# Patient Record
Sex: Female | Born: 1950 | Race: White | Hispanic: No | Marital: Married | Smoking: Never smoker
Health system: Southern US, Community
[De-identification: ages and names within clinical notes are randomized; demographics above are authoritative.]

## PROBLEM LIST (undated history)

## (undated) DIAGNOSIS — Z8719 Personal history of other diseases of the digestive system: Secondary | ICD-10-CM

## (undated) DIAGNOSIS — C50919 Malignant neoplasm of unspecified site of unspecified female breast: Secondary | ICD-10-CM

## (undated) DIAGNOSIS — N200 Calculus of kidney: Secondary | ICD-10-CM

## (undated) DIAGNOSIS — E785 Hyperlipidemia, unspecified: Secondary | ICD-10-CM

## (undated) DIAGNOSIS — C801 Malignant (primary) neoplasm, unspecified: Secondary | ICD-10-CM

## (undated) DIAGNOSIS — Z8709 Personal history of other diseases of the respiratory system: Secondary | ICD-10-CM

## (undated) DIAGNOSIS — R202 Paresthesia of skin: Secondary | ICD-10-CM

## (undated) DIAGNOSIS — Z973 Presence of spectacles and contact lenses: Secondary | ICD-10-CM

## (undated) DIAGNOSIS — K8020 Calculus of gallbladder without cholecystitis without obstruction: Secondary | ICD-10-CM

## (undated) DIAGNOSIS — J101 Influenza due to other identified influenza virus with other respiratory manifestations: Secondary | ICD-10-CM

## (undated) DIAGNOSIS — K642 Third degree hemorrhoids: Secondary | ICD-10-CM

## (undated) DIAGNOSIS — Z86010 Personal history of colonic polyps: Secondary | ICD-10-CM

## (undated) DIAGNOSIS — R200 Anesthesia of skin: Secondary | ICD-10-CM

## (undated) DIAGNOSIS — K219 Gastro-esophageal reflux disease without esophagitis: Secondary | ICD-10-CM

## (undated) DIAGNOSIS — J111 Influenza due to unidentified influenza virus with other respiratory manifestations: Secondary | ICD-10-CM

## (undated) DIAGNOSIS — Z923 Personal history of irradiation: Secondary | ICD-10-CM

## (undated) HISTORY — DX: Calculus of gallbladder without cholecystitis without obstruction: K80.20

## (undated) HISTORY — DX: Influenza due to unidentified influenza virus with other respiratory manifestations: J11.1

## (undated) HISTORY — DX: Gastro-esophageal reflux disease without esophagitis: K21.9

## (undated) HISTORY — DX: Hyperlipidemia, unspecified: E78.5

## (undated) HISTORY — PX: CYSTO: SHX6284

## (undated) HISTORY — DX: Personal history of colonic polyps: Z86.010

## (undated) HISTORY — PX: TONSILLECTOMY: SUR1361

## (undated) HISTORY — DX: Third degree hemorrhoids: K64.2

## (undated) HISTORY — PX: OTHER SURGICAL HISTORY: SHX169

## (undated) HISTORY — DX: Calculus of kidney: N20.0

## (undated) HISTORY — DX: Influenza due to other identified influenza virus with other respiratory manifestations: J10.1

## (undated) HISTORY — PX: COLONOSCOPY: SHX174

## (undated) HISTORY — PX: ERCP: SHX60

## (undated) HISTORY — PX: OVARIAN CYST REMOVAL: SHX89

## (undated) HISTORY — PX: HEMORRHOID BANDING: SHX5850

## (undated) HISTORY — PX: POLYPECTOMY: SHX149

---

## 1978-02-23 HISTORY — PX: DILATION AND CURETTAGE OF UTERUS: SHX78

## 2003-07-06 DIAGNOSIS — Z86010 Personal history of colonic polyps: Secondary | ICD-10-CM

## 2003-07-06 DIAGNOSIS — Z860101 Personal history of adenomatous and serrated colon polyps: Secondary | ICD-10-CM

## 2003-07-06 HISTORY — DX: Personal history of adenomatous and serrated colon polyps: Z86.0101

## 2003-07-06 HISTORY — DX: Personal history of colonic polyps: Z86.010

## 2004-02-24 HISTORY — PX: ABDOMINAL HYSTERECTOMY: SHX81

## 2006-02-23 HISTORY — PX: CHOLECYSTECTOMY: SHX55

## 2006-03-31 ENCOUNTER — Ambulatory Visit (HOSPITAL_BASED_OUTPATIENT_CLINIC_OR_DEPARTMENT_OTHER): Admission: RE | Admit: 2006-03-31 | Discharge: 2006-03-31 | Payer: Self-pay | Admitting: Urology

## 2007-07-29 ENCOUNTER — Ambulatory Visit: Payer: Self-pay | Admitting: Internal Medicine

## 2007-07-29 DIAGNOSIS — R209 Unspecified disturbances of skin sensation: Secondary | ICD-10-CM | POA: Insufficient documentation

## 2007-07-29 DIAGNOSIS — R109 Unspecified abdominal pain: Secondary | ICD-10-CM | POA: Insufficient documentation

## 2007-07-29 DIAGNOSIS — E785 Hyperlipidemia, unspecified: Secondary | ICD-10-CM

## 2007-07-29 DIAGNOSIS — R112 Nausea with vomiting, unspecified: Secondary | ICD-10-CM

## 2007-08-01 LAB — CONVERTED CEMR LAB
ALT: 43 units/L — ABNORMAL HIGH (ref 0–35)
AST: 30 units/L (ref 0–37)
Albumin: 4 g/dL (ref 3.5–5.2)
Alkaline Phosphatase: 108 units/L (ref 39–117)
Amylase: 66 units/L (ref 27–131)
BUN: 15 mg/dL (ref 6–23)
Basophils Relative: 0.4 % (ref 0.0–1.0)
CO2: 30 meq/L (ref 19–32)
Chloride: 103 meq/L (ref 96–112)
Creatinine, Ser: 0.8 mg/dL (ref 0.4–1.2)
Eosinophils Absolute: 0.1 10*3/uL (ref 0.0–0.7)
Eosinophils Relative: 1.7 % (ref 0.0–5.0)
Glucose, Bld: 100 mg/dL — ABNORMAL HIGH (ref 70–99)
Lymphocytes Relative: 28.7 % (ref 12.0–46.0)
MCV: 90.9 fL (ref 78.0–100.0)
Monocytes Relative: 5.1 % (ref 3.0–12.0)
Neutrophils Relative %: 64.1 % (ref 43.0–77.0)
Nitrite: NEGATIVE
Platelets: 448 10*3/uL — ABNORMAL HIGH (ref 150–400)
Potassium: 4.3 meq/L (ref 3.5–5.1)
RBC: 4.41 M/uL (ref 3.87–5.11)
Specific Gravity, Urine: <=1.005 (ref 1.000–1.03)
TSH: 2.11 microintl units/mL (ref 0.35–5.50)
Total Protein: 7.3 g/dL (ref 6.0–8.3)
Urine Glucose: NEGATIVE mg/dL
Urobilinogen, UA: 0.2 (ref 0.0–1.0)
WBC: 6.2 10*3/uL (ref 4.5–10.5)

## 2007-08-18 ENCOUNTER — Encounter: Payer: Self-pay | Admitting: Internal Medicine

## 2007-08-23 ENCOUNTER — Encounter: Payer: Self-pay | Admitting: Internal Medicine

## 2007-10-25 ENCOUNTER — Ambulatory Visit: Payer: Self-pay | Admitting: Internal Medicine

## 2007-10-27 LAB — CONVERTED CEMR LAB
ALT: 14 units/L (ref 0–35)
AST: 18 units/L (ref 0–37)
Albumin: 3.8 g/dL (ref 3.5–5.2)
Alkaline Phosphatase: 62 units/L (ref 39–117)
Sed Rate: 9 mm/hr (ref 0–22)

## 2008-04-23 ENCOUNTER — Ambulatory Visit: Payer: Self-pay | Admitting: Internal Medicine

## 2008-04-23 DIAGNOSIS — K219 Gastro-esophageal reflux disease without esophagitis: Secondary | ICD-10-CM

## 2008-05-07 ENCOUNTER — Encounter: Payer: Self-pay | Admitting: Internal Medicine

## 2009-03-22 ENCOUNTER — Ambulatory Visit: Payer: Self-pay | Admitting: Internal Medicine

## 2009-03-22 DIAGNOSIS — J019 Acute sinusitis, unspecified: Secondary | ICD-10-CM | POA: Insufficient documentation

## 2010-03-25 NOTE — Assessment & Plan Note (Signed)
Summary: COLD SYMPTOMS--[facility]C   Vital Signs:  Patient profile:   60 year old female Height:      65 inches Weight:      158 pounds BMI:     26.39 Temp:     98.1 degrees F oral Pulse rate:   70 / minute BP sitting:   104 / 66  (left arm)  Vitals Entered By: Tora Perches (March 22, 2009 3:47 PM) CC: cold sx Is Patient Diabetic? No   CC:  cold sx.  History of Present Illness: The patient presents with complaints of, cough, sinus congestion and drainge of 6 wks duration. Not better with OTC meds. Chest hurts with coughing.   The mucus is colored.   Preventive Screening-Counseling & Management  Alcohol-Tobacco     Smoking Status: never  Current Medications (verified): 1)  Ranitidine Hcl 150 Mg Caps (Ranitidine Hcl) .Marland Kitchen.. 1 Po Bid 2)  Vitamin D3 1000 Unit  Tabs (Cholecalciferol) .Marland Kitchen.. 1 By Mouth Daily  Allergies (verified): No Known Drug Allergies  Social History: Smoking Status:  never  Physical Exam  General:  Well-developed,well-nourished,in no acute distress; alert,appropriate and cooperative throughout examination Mouth:  Erythematous throat mucosa and intranasal erythema.  Lungs:  Normal respiratory effort, chest expands symmetrically. Lungs are clear to auscultation, no crackles or wheezes. Heart:  Normal rate and regular rhythm. S1 and S2 normal without gallop, murmur, click, rub or other extra sounds. Skin:  Intact without suspicious lesions or rashes   Impression & Recommendations:  Problem # 1:  SINUSITIS, ACUTE (ICD-461.9) Assessment New  Her updated medication list for this problem includes:    Ceftin 500 Mg Tabs (Cefuroxime axetil) .Marland Kitchen... 1 by mouth bid  Complete Medication List: 1)  Ranitidine Hcl 150 Mg Caps (Ranitidine hcl) .Marland Kitchen.. 1 po bid 2)  Vitamin D3 1000 Unit Tabs (Cholecalciferol) .Marland Kitchen.. 1 by mouth daily 3)  Ceftin 500 Mg Tabs (Cefuroxime axetil) .Marland Kitchen.. 1 by mouth bid  Patient Instructions: 1)  Call if you are not better in a reasonable amount of  time or if worse.  Prescriptions: CEFTIN 500 MG TABS (CEFUROXIME AXETIL) 1 by mouth bid  #20 x 1   Entered and Authorized by:   Tresa Garter MD   Signed by:   Tresa Garter MD on 03/22/2009   Method used:   Print then Give to Patient   RxID:   [id number]

## 2010-03-31 ENCOUNTER — Telehealth: Payer: Self-pay | Admitting: Internal Medicine

## 2010-04-04 ENCOUNTER — Other Ambulatory Visit: Payer: Self-pay

## 2010-04-04 ENCOUNTER — Encounter (INDEPENDENT_AMBULATORY_CARE_PROVIDER_SITE_OTHER): Payer: Self-pay | Admitting: *Deleted

## 2010-04-04 ENCOUNTER — Other Ambulatory Visit: Payer: Self-pay | Admitting: Internal Medicine

## 2010-04-04 DIAGNOSIS — E7800 Pure hypercholesterolemia, unspecified: Secondary | ICD-10-CM

## 2010-04-04 DIAGNOSIS — E785 Hyperlipidemia, unspecified: Secondary | ICD-10-CM

## 2010-04-04 DIAGNOSIS — Z0000 Encounter for general adult medical examination without abnormal findings: Secondary | ICD-10-CM

## 2010-04-04 LAB — BASIC METABOLIC PANEL
BUN: 20 mg/dL (ref 6–23)
Creatinine, Ser: 0.8 mg/dL (ref 0.4–1.2)
GFR: 81.99 mL/min (ref >60.00)
Glucose, Bld: 88 mg/dL (ref 70–99)
Potassium: 4.4 mEq/L (ref 3.5–5.1)

## 2010-04-04 LAB — LIPID PANEL
Cholesterol: 257 mg/dL — ABNORMAL HIGH (ref 0–200)
HDL: 65.4 mg/dL (ref >39.00)
Total CHOL/HDL Ratio: 4
Triglycerides: 77 mg/dL (ref 0.0–149.0)
VLDL: 15.4 mg/dL (ref 0.0–40.0)

## 2010-04-04 LAB — CBC WITH DIFFERENTIAL/PLATELET
Basophils Absolute: 0 10*3/uL (ref 0.0–0.1)
Eosinophils Relative: 3.1 % (ref 0.0–5.0)
HCT: 40.5 % (ref 36.0–46.0)
Lymphs Abs: 2 10*3/uL (ref 0.7–4.0)
MCV: 90.8 fl (ref 78.0–100.0)
Monocytes Absolute: 0.3 10*3/uL (ref 0.1–1.0)
Monocytes Relative: 7.9 % (ref 3.0–12.0)
Neutrophils Relative %: 38.2 % — ABNORMAL LOW (ref 43.0–77.0)
Platelets: 340 10*3/uL (ref 150.0–400.0)
RDW: 12.8 % (ref 11.5–14.6)
WBC: 4 10*3/uL — ABNORMAL LOW (ref 4.5–10.5)

## 2010-04-04 LAB — HEPATIC FUNCTION PANEL
Bilirubin, Direct: 0.2 mg/dL (ref 0.0–0.3)
Total Bilirubin: 1 mg/dL (ref 0.3–1.2)

## 2010-04-04 LAB — URINALYSIS
Bilirubin Urine: NEGATIVE
Ketones, ur: NEGATIVE
Leukocytes, UA: NEGATIVE
Urine Glucose: NEGATIVE
Urobilinogen, UA: 0.2 (ref 0.0–1.0)

## 2010-04-04 LAB — TSH: TSH: 1.45 u[IU]/mL (ref 0.35–5.50)

## 2010-04-10 NOTE — Progress Notes (Signed)
Summary: NEED OV  Phone Note Call from Patient Call back at Home Phone [phone]   Caller: Patient--[phone] Call For: Tresa Garter MD Summary of Call: Pt states she wants to do cpx labs only, she does not want to sched cpx office visit. She does pap/mammo with her gynecologist. Please advise Initial call taken by: Verdell Face,  March 31, 2010 10:52 AM  Follow-up for Phone Call        Last office visit for f/u was 04/2008. At last visit pt was advised to come back in once year for cpx w/labs prior which she did not schedule. Patient needs CPX office visit correct?  Follow-up by: Lamar Sprinkles, CMA,  March 31, 2010 12:19 PM  Additional Follow-up for Phone Call Additional follow up Details #1::        We can order labs. She will need OV if abn labs Will order CBC, TSH, BMET, Hepatic panel, UA, Lipids, Dx: V70.0, 272.0 Additional Follow-up by: Tresa Garter MD,  April 01, 2010 8:11 AM    Additional Follow-up for Phone Call Additional follow up Details #2::    Patient notified and labs placed in EP[facility].Marland KitchenAlvy Beal Archie CMA  April 01, 2010 11:13 AM

## 2010-04-25 ENCOUNTER — Ambulatory Visit (INDEPENDENT_AMBULATORY_CARE_PROVIDER_SITE_OTHER): Payer: 59 | Admitting: Internal Medicine

## 2010-04-25 ENCOUNTER — Encounter: Payer: Self-pay | Admitting: Internal Medicine

## 2010-04-25 DIAGNOSIS — K219 Gastro-esophageal reflux disease without esophagitis: Secondary | ICD-10-CM

## 2010-04-25 DIAGNOSIS — M25519 Pain in unspecified shoulder: Secondary | ICD-10-CM | POA: Insufficient documentation

## 2010-04-25 DIAGNOSIS — E785 Hyperlipidemia, unspecified: Secondary | ICD-10-CM

## 2010-05-06 NOTE — Assessment & Plan Note (Signed)
Summary: FOLLOW UP ON LABS/NWS   Vital Signs:  Patient profile:   60 year old female Height:      65 inches Weight:      164 pounds BMI:     27.39 Temp:     98.4 degrees F oral Pulse rate:   72 / minute Pulse rhythm:   regular Resp:     16 per minute BP sitting:   118 / 80  (left arm) Cuff size:   regular  Vitals Entered By: Lanier Prude, Beverly Gust) (April 25, 2010 9:36 AM) CC: f/u  Is Patient Diabetic? No   CC:  f/u .  History of Present Illness: F/u high chol off Lipitor now C/o R shoulder pain after she fell; stiff F/u GERD  Current Medications (verified): 1)  Ranitidine Hcl 150 Mg Caps (Ranitidine Hcl) .Marland Kitchen.. 1 Po Bid 2)  Vitamin D3 1000 Unit  Tabs (Cholecalciferol) .Marland Kitchen.. 1 By Mouth Daily 3)  Aspirin 81 Mg Tbec (Aspirin) .Marland Kitchen.. 1 By Mouth Once Daily 4)  Centrum Pro Nutrients Omega-3 .Marland Kitchen.. 1 By Mouth Once Daily 5)  I-Cool For Hot Flashes .Marland Kitchen.. 1 By Mouth Once Daily  Allergies (verified): No Known Drug Allergies  Past History:  Past Medical History: Last updated: 04/23/2008 Kidney stones Dr Lynnae Sandhoff Hyperlipidemia [place] GERD  Family History: Last updated: 07/29/2007 Family History Hypertension No CAD  Social History: Last updated: 07/29/2007 Occupation:computers Married 2 children Alcohol use-no  Review of Systems  The patient denies weight loss, chest pain, dyspnea on exertion, and abdominal pain.    Physical Exam  General:  Well-developed,well-nourished,in no acute distress; alert,appropriate and cooperative throughout examination Eyes:  No corneal or conjunctival inflammation noted. EOMI. Perrla Nose:  External nasal examination shows no deformity or inflammation. Nasal mucosa are pink and moist without lesions or exudates. Mouth:  Erythematous throat mucosa and intranasal erythema.  Neck:  WNL Lungs:  Normal respiratory effort, chest expands symmetrically. Lungs are clear to auscultation, no crackles or wheezes. Heart:  Normal rate and regular  rhythm. S1 and S2 normal without gallop, murmur, click, rub or other extra sounds. Abdomen:  Bowel sounds positive,abdomen soft and non-tender without masses, organomegaly or hernias noted. Msk:  R shoulder subacromial space and R AC joints are tender to palpation. and ROM Neurologic:  No cranial nerve deficits noted. Station and gait are normal. Plantar reflexes are down-going bilaterally. DTRs are symmetrical throughout. Sensory, motor and coordinative functions appear intact. Skin:  WNL Psych:  Cognition and judgment appear intact. Alert and cooperative with normal attention span and concentration. No apparent delusions, illusions, hallucinations   Impression & Recommendations:  Problem # 1:  HYPERLIPIDEMIA (ICD-272.4) Assessment Deteriorated  Her updated medication list for this problem includes:    Crestor 10 Mg Tabs (Rosuvastatin calcium) .Marland Kitchen... 1 by mouth once daily for cholesterol  Labs Reviewed: SGOT: 16 (04/04/2010)   SGPT: 16 (04/04/2010)   HDL:65.40 (04/04/2010)  Chol:257 (04/04/2010)  Trig:77.0 (04/04/2010)  Problem # 2:  SHOULDER PAIN (ICD-719.41) Right - S/P FALL Assessment: New WE CAN INJECT AC AND SUBACR Stretch Her updated medication list for this problem includes:    Aspirin 81 Mg Tbec (Aspirin) .Marland Kitchen... 1 by mouth once daily    Ibuprofen 600 Mg Tabs (Ibuprofen) .Marland Kitchen... 1 by mouth bid  pc x 1 wk then as needed for  pain  Problem # 3:  GERD (ICD-530.81) Assessment: Improved  Her updated medication list for this problem includes:    Ranitidine Hcl 150 Mg Caps (Ranitidine hcl) .Marland KitchenMarland KitchenMarland KitchenMarland Kitchen  1 po bid  Complete Medication List: 1)  Ranitidine Hcl 150 Mg Caps (Ranitidine hcl) .Marland Kitchen.. 1 po bid 2)  Vitamin D3 1000 Unit Tabs (Cholecalciferol) .Marland Kitchen.. 1 by mouth daily 3)  Aspirin 81 Mg Tbec (Aspirin) .Marland Kitchen.. 1 by mouth once daily 4)  Centrum Pro Nutrients Omega-3  .Marland Kitchen.. 1 by mouth once daily 5)  I-cool For Hot Flashes  .Marland KitchenMarland Kitchen. 1 by mouth once daily 6)  Crestor 10 Mg Tabs (Rosuvastatin calcium)  .Marland Kitchen.. 1 by mouth once daily for cholesterol 7)  Ibuprofen 600 Mg Tabs (Ibuprofen) .Marland Kitchen.. 1 by mouth bid  pc x 1 wk then as needed for  pain  Patient Instructions: 1)  Please schedule a follow-up appointment in 4 months. 2)  BMP prior to visit, ICD-9: 3)  Hepatic Panel prior to visit, ICD-9: 4)  CBC w/ Diff prior to visit, ICD-9:272.0  995.20 Prescriptions: IBUPROFEN 600 MG TABS (IBUPROFEN) 1 by mouth bid  pc x 1 wk then as needed for  pain  #60 x 3   Entered and Authorized by:   Tresa Garter MD   Signed by:   Tresa Garter MD on 04/25/2010   Method used:   Print then Give to Patient   RxID:   [id number] CRESTOR 10 MG TABS (ROSUVASTATIN CALCIUM) 1 by mouth once daily for cholesterol  #30 x 12   Entered and Authorized by:   Tresa Garter MD   Signed by:   Tresa Garter MD on 04/25/2010   Method used:   Print then Give to Patient   RxID:   [id number]    Orders Added: 1)  Est. Patient Level IV [41324]

## 2010-07-11 NOTE — Op Note (Signed)
Kristy Singh, Kristy Singh             ACCOUNT NO.:  [id number]   MEDICAL RECORD NO.:  [id number]          PATIENT TYPE:  AMB   LOCATION:  [facility]                         FACILITY:  [facility]   PHYSICIAN:  Bertram Millard. Dahlstedt, M.D.DATE OF BIRTH:  18-Aug-1950   DATE OF PROCEDURE:  03/31/2006  DATE OF DISCHARGE:                               OPERATIVE REPORT   PREOPERATIVE DIAGNOSIS:  Right ureteral calculi.   POSTOPERATIVE DIAGNOSIS:  Right ureteral calculi.   PRINCIPAL PROCEDURE:  Cysto, right retrograde ureteropyelogram, right  ureteroscopy with holmium laser and extraction of right ureteral  calculi, double-J stent placement.   SURGEON:  Bertram Millard. Dahlstedt, M.D.   ANESTHESIA:  General with LMA.   COMPLICATIONS:  None.   BRIEF HISTORY:  A 60 year old female who presented to my office about 2  weeks ago with intermittent right lower back pain.  She has had the pain  for several months but it became worse a month and a half ago.  She does  have a history of prior kidney stones.   The patient was found to have microscopic hematuria in the office.  She  underwent CT urogram showing significant right hydronephrosis.  There  were two stones in the kidney measuring 4 and 8 mm.  She had two right  ureteral calculi, one at the UVJ 3 mm in size and a very large right mid  to distal ureteral stone.  She presents at this time for ureteroscopy  and stone extraction.   DESCRIPTION OF PROCEDURE:  The patient was identified and the surgical  side marked in the holding area.  She was taken to the operating room  after the antibiotics were administered intravenously and where general  LMA anesthetic was administered.  She was placed in the dorsal lithotomy  position.  Genitalia and perineum were prepped and draped.  A cystoscope  was advanced into the bladder which appeared normal.  Single orifices  were noted.   At this point, a right retrograde was pyelogram was performed.  This  revealed a  narrowed distal ureter for approximately 6-7 cm.  There was  one small filling defect perhaps 6 mm in size in this area.  Above this,  there was narrowing and significant hydroureter proximal to that.  There  was a filling defect approximately 9 mm in size there.  be No other  filling defects were seen in this ureter.   At this point, a guidewire was placed and the distal ureter dilated with  an inner sheath of an access sheath.  The sheath was removed.  The  guidewire was left in and the ureteroscope was advanced up to the distal  ureteral stone which was seemingly about 6 mm in size.  It was  fragmented into several fragments which were then extracted with the  nitinol basket.  The ureteroscope was then advanced up to the a  hydronephrotic ureter.  A larger stone was seen, fragmented into several  fragments which were then also extracted.  Several small less than 1 mm  fragments were left in, it was felt that they would wash out.  I  did not  see any other stones.  At this point, the ureteroscope was removed and a  6-French 24 cm double-J stent (Microvasive contour stent) was placed  using fluoroscopic and cystoscopic guidance.  The string was not left  on.  Good proximal and distal curls were seen.  The bladder was drained  and the procedure was terminated.   The patient tolerated the procedure well.  She was awakened and taken to  PACU in stable condition.   She was discharged on her pain medication which includes Percocet, as  well as a prescription for Urelle 1 p.o. q.6 h p.r.n. frequency or  dysuria and Cipro 250 mg 1 p.o. b.i.d. for 3 days and 1 p.o. daily  thereafter  #15.      Bertram Millard. Dahlstedt, M.D.  Electronically Signed     SMD/MEDQ  D:  03/31/2006  T:  03/31/2006  Job:  [id number]   cc:   Roe Rutherford, MD  [place], [place]

## 2010-08-22 ENCOUNTER — Other Ambulatory Visit (INDEPENDENT_AMBULATORY_CARE_PROVIDER_SITE_OTHER): Payer: 59

## 2010-08-22 ENCOUNTER — Other Ambulatory Visit: Payer: Self-pay | Admitting: Internal Medicine

## 2010-08-22 DIAGNOSIS — E7800 Pure hypercholesterolemia, unspecified: Secondary | ICD-10-CM

## 2010-08-22 DIAGNOSIS — T887XXA Unspecified adverse effect of drug or medicament, initial encounter: Secondary | ICD-10-CM

## 2010-08-22 LAB — CBC WITH DIFFERENTIAL/PLATELET
Basophils Absolute: 0 10*3/uL (ref 0.0–0.1)
Eosinophils Relative: 4.2 % (ref 0.0–5.0)
HCT: 41.2 % (ref 36.0–46.0)
Lymphocytes Relative: 44.9 % (ref 12.0–46.0)
Lymphs Abs: 1.8 10*3/uL (ref 0.7–4.0)
Monocytes Relative: 7.9 % (ref 3.0–12.0)
Neutrophils Relative %: 42 % — ABNORMAL LOW (ref 43.0–77.0)
Platelets: 334 10*3/uL (ref 150.0–400.0)
RDW: 12.8 % (ref 11.5–14.6)
WBC: 4.1 10*3/uL — ABNORMAL LOW (ref 4.5–10.5)

## 2010-08-22 LAB — HEPATIC FUNCTION PANEL
AST: 18 U/L (ref 0–37)
Albumin: 4.3 g/dL (ref 3.5–5.2)
Alkaline Phosphatase: 89 U/L (ref 39–117)
Total Protein: 6.9 g/dL (ref 6.0–8.3)

## 2010-08-22 LAB — BASIC METABOLIC PANEL
BUN: 16 mg/dL (ref 6–23)
CO2: 30 mEq/L (ref 19–32)
GFR: 91.4 mL/min (ref >60.00)
Glucose, Bld: 93 mg/dL (ref 70–99)
Potassium: 4.5 mEq/L (ref 3.5–5.1)

## 2010-08-29 ENCOUNTER — Encounter: Payer: Self-pay | Admitting: Internal Medicine

## 2010-08-29 ENCOUNTER — Other Ambulatory Visit (INDEPENDENT_AMBULATORY_CARE_PROVIDER_SITE_OTHER): Payer: 59

## 2010-08-29 ENCOUNTER — Other Ambulatory Visit: Payer: Self-pay | Admitting: Internal Medicine

## 2010-08-29 ENCOUNTER — Ambulatory Visit (INDEPENDENT_AMBULATORY_CARE_PROVIDER_SITE_OTHER): Payer: 59 | Admitting: Internal Medicine

## 2010-08-29 DIAGNOSIS — K219 Gastro-esophageal reflux disease without esophagitis: Secondary | ICD-10-CM

## 2010-08-29 DIAGNOSIS — R635 Abnormal weight gain: Secondary | ICD-10-CM

## 2010-08-29 DIAGNOSIS — E785 Hyperlipidemia, unspecified: Secondary | ICD-10-CM

## 2010-08-29 LAB — LIPID PANEL
HDL: 63.9 mg/dL (ref >39.00)
Triglycerides: 149 mg/dL (ref 0.0–149.0)
VLDL: 29.8 mg/dL (ref 0.0–40.0)

## 2010-08-29 LAB — LDL CHOLESTEROL, DIRECT: Direct LDL: 189.4 mg/dL

## 2010-08-29 NOTE — Progress Notes (Signed)
  Subjective:    Patient ID: Kristy Singh, female    DOB: Jul 27, 1950, 60 y.o.   MRN: [id number]  HPI   F/u dyslipidemia and GERD. She stopped Crestor - taking Red rice yeast   Review of Systems  Constitutional: Positive for unexpected weight change (wt gain). Negative for chills, activity change, appetite change and fatigue.  HENT: Negative for congestion, mouth sores and sinus pressure.   Eyes: Negative for visual disturbance.  Respiratory: Negative for cough and chest tightness.   Gastrointestinal: Negative for nausea and abdominal pain.  Genitourinary: Negative for frequency, difficulty urinating and vaginal pain.  Musculoskeletal: Negative for back pain and gait problem.  Skin: Negative for pallor and rash.  Neurological: Negative for dizziness, tremors, weakness, numbness and headaches.  Psychiatric/Behavioral: Negative for confusion and sleep disturbance.   Wt Readings from Last 3 Encounters:  08/29/10 169 lb (76.658 kg)  04/25/10 164 lb (74.39 kg)  03/22/09 158 lb (71.668 kg)       Objective:   Physical Exam  Constitutional: She appears well-developed and well-nourished. No distress.  HENT:  Head: Normocephalic.  Right Ear: External ear normal.  Left Ear: External ear normal.  Nose: Nose normal.  Mouth/Throat: Oropharynx is clear and moist.  Eyes: Conjunctivae are normal. Pupils are equal, round, and reactive to light. Right eye exhibits no discharge. Left eye exhibits no discharge.  Neck: Normal range of motion. Neck supple. No JVD present. No tracheal deviation present. No thyromegaly present.  Cardiovascular: Normal rate, regular rhythm and normal heart sounds.   Pulmonary/Chest: No stridor. No respiratory distress. She has no wheezes.  Abdominal: Soft. Bowel sounds are normal. She exhibits no distension and no mass. There is no tenderness. There is no rebound and no guarding.  Musculoskeletal: She exhibits no edema and no tenderness.  Lymphadenopathy:    She has  no cervical adenopathy.  Neurological: She displays normal reflexes. No cranial nerve deficit. She exhibits normal muscle tone. Coordination normal.  Skin: No rash noted. No erythema.  Psychiatric: She has a normal mood and affect. Her behavior is normal. Judgment and thought content normal.        Lab Results  Component Value Date   WBC 4.1* 08/22/2010   HGB 14.4 08/22/2010   HCT 41.2 08/22/2010   PLT 334.0 08/22/2010   CHOL 257* 04/04/2010   TRIG 77.0 04/04/2010   HDL 65.40 04/04/2010   LDLDIRECT 181.1 04/04/2010   ALT 18 08/22/2010   AST 18 08/22/2010   NA 141 08/22/2010   K 4.5 08/22/2010   CL 107 08/22/2010   CREATININE 0.7 08/22/2010   BUN 16 08/22/2010   CO2 30 08/22/2010   TSH 1.45 04/04/2010     Assessment & Plan:

## 2010-08-29 NOTE — Assessment & Plan Note (Signed)
On Rx 

## 2010-08-29 NOTE — Assessment & Plan Note (Signed)
Discussed.

## 2010-09-01 ENCOUNTER — Telehealth: Payer: Self-pay | Admitting: Internal Medicine

## 2010-09-01 NOTE — Telephone Encounter (Signed)
Please, mail the labs to the patient.     Thx 

## 2010-09-01 NOTE — Telephone Encounter (Signed)
Copies mailed to pt

## 2010-09-01 NOTE — Progress Notes (Unsigned)
  Subjective:    Patient ID: Kristy Singh, female    DOB: 1950/06/06, 60 y.o.   MRN: [id number]  HPI    Review of Systems     Objective:   Physical Exam        Assessment & Plan:

## 2011-03-06 ENCOUNTER — Ambulatory Visit: Payer: 59 | Admitting: Internal Medicine

## 2011-03-12 ENCOUNTER — Other Ambulatory Visit: Payer: Self-pay | Admitting: *Deleted

## 2011-03-12 MED ORDER — ROSUVASTATIN CALCIUM 10 MG PO TABS: 10.0000 mg | ORAL_TABLET | Freq: Every day | ORAL | Status: DC

## 2011-05-15 ENCOUNTER — Other Ambulatory Visit (INDEPENDENT_AMBULATORY_CARE_PROVIDER_SITE_OTHER): Payer: 59

## 2011-05-15 ENCOUNTER — Ambulatory Visit (INDEPENDENT_AMBULATORY_CARE_PROVIDER_SITE_OTHER): Payer: 59 | Admitting: Internal Medicine

## 2011-05-15 ENCOUNTER — Encounter: Payer: Self-pay | Admitting: Internal Medicine

## 2011-05-15 VITALS — BP 120/80 | HR 84 | Temp 99.7°F | Resp 16 | Wt 169.0 lb

## 2011-05-15 DIAGNOSIS — E785 Hyperlipidemia, unspecified: Secondary | ICD-10-CM

## 2011-05-15 DIAGNOSIS — K219 Gastro-esophageal reflux disease without esophagitis: Secondary | ICD-10-CM

## 2011-05-15 LAB — HEPATIC FUNCTION PANEL
ALT: 21 U/L (ref 0–35)
AST: 21 U/L (ref 0–37)
Albumin: 4.6 g/dL (ref 3.5–5.2)
Alkaline Phosphatase: 104 U/L (ref 39–117)
Total Protein: 7.4 g/dL (ref 6.0–8.3)

## 2011-05-15 LAB — BASIC METABOLIC PANEL
BUN: 16 mg/dL (ref 6–23)
CO2: 28 mEq/L (ref 19–32)
Glucose, Bld: 99 mg/dL (ref 70–99)
Potassium: 4.2 mEq/L (ref 3.5–5.1)
Sodium: 139 mEq/L (ref 135–145)

## 2011-05-15 MED ORDER — ROSUVASTATIN CALCIUM 10 MG PO TABS: 10.0000 mg | ORAL_TABLET | Freq: Every day | ORAL | Status: DC

## 2011-05-15 NOTE — Assessment & Plan Note (Signed)
Continue with current prescription therapy as reflected on the Med list. Labs  

## 2011-05-16 ENCOUNTER — Telehealth: Payer: Self-pay | Admitting: Internal Medicine

## 2011-05-16 NOTE — Telephone Encounter (Signed)
Kristy Singh, please, see if lipids were done. If not, can they run them? Thx

## 2011-05-16 NOTE — Assessment & Plan Note (Signed)
Continue with current prescription therapy as reflected on the Med list.  

## 2011-05-16 NOTE — Progress Notes (Signed)
Patient ID: Xaniyah Buchholz, female   DOB: 1950/05/10, 61 y.o.   MRN: [id number]  Subjective:    Patient ID: Annitta Jersey, female    DOB: 1950/04/12, 61 y.o.   MRN: [id number]  HPI   F/u dyslipidemia and GERD. She is back on Crestor - not taking Red rice yeast   Review of Systems  Constitutional: Negative for chills, activity change, appetite change, fatigue and unexpected weight change.  HENT: Negative for congestion, mouth sores and sinus pressure.   Eyes: Negative for visual disturbance.  Respiratory: Negative for cough and chest tightness.   Gastrointestinal: Negative for nausea and abdominal pain.  Genitourinary: Negative for frequency, difficulty urinating and vaginal pain.  Musculoskeletal: Negative for back pain and gait problem.  Skin: Negative for pallor and rash.  Neurological: Negative for dizziness, tremors, weakness, numbness and headaches.  Psychiatric/Behavioral: Negative for confusion and sleep disturbance.   Wt Readings from Last 3 Encounters:  05/15/11 169 lb (76.658 kg)  08/29/10 169 lb (76.658 kg)  04/25/10 164 lb (74.39 kg)       Objective:   Physical Exam  Constitutional: She appears well-developed and well-nourished. No distress.  HENT:  Head: Normocephalic.  Right Ear: External ear normal.  Left Ear: External ear normal.  Nose: Nose normal.  Mouth/Throat: Oropharynx is clear and moist.  Eyes: Conjunctivae are normal. Pupils are equal, round, and reactive to light. Right eye exhibits no discharge. Left eye exhibits no discharge.  Neck: Normal range of motion. Neck supple. No JVD present. No tracheal deviation present. No thyromegaly present.  Cardiovascular: Normal rate, regular rhythm and normal heart sounds.   Pulmonary/Chest: No stridor. No respiratory distress. She has no wheezes.  Abdominal: Soft. Bowel sounds are normal. She exhibits no distension and no mass. There is no tenderness. There is no rebound and no guarding.  Musculoskeletal: She  exhibits no edema and no tenderness.  Lymphadenopathy:    She has no cervical adenopathy.  Neurological: She displays normal reflexes. No cranial nerve deficit. She exhibits normal muscle tone. Coordination normal.  Skin: No rash noted. No erythema.  Psychiatric: She has a normal mood and affect. Her behavior is normal. Judgment and thought content normal.        Lab Results  Component Value Date   WBC 4.1* 08/22/2010   HGB 14.4 08/22/2010   HCT 41.2 08/22/2010   PLT 334.0 08/22/2010   CHOL 260* 08/29/2010   TRIG 149.0 08/29/2010   HDL 63.90 08/29/2010   LDLDIRECT 189.4 08/29/2010   ALT 21 05/15/2011   AST 21 05/15/2011   NA 139 05/15/2011   K 4.2 05/15/2011   CL 103 05/15/2011   CREATININE 0.8 05/15/2011   BUN 16 05/15/2011   CO2 28 05/15/2011   TSH 1.45 04/04/2010     Assessment & Plan:

## 2011-05-18 LAB — LIPID PANEL: Cholesterol: 200 mg/dL (ref 0–200)

## 2011-05-18 NOTE — Telephone Encounter (Signed)
Expected date for lipids is May. Per Synetta Fail in [place] lab they will not run if expected date is more than a month out. Add on faxed to lab.

## 2011-05-18 NOTE — Telephone Encounter (Signed)
All good  Please, mail the labs to the patient. Thx

## 2011-05-18 NOTE — Telephone Encounter (Signed)
Results are in system now.

## 2011-05-18 NOTE — Telephone Encounter (Signed)
Pls ask the pt to stop by the lab again for lipids if they can't run it off avail sample Thx

## 2011-05-19 NOTE — Telephone Encounter (Signed)
Done

## 2011-06-04 ENCOUNTER — Telehealth: Payer: Self-pay | Admitting: *Deleted

## 2011-06-04 DIAGNOSIS — Z0000 Encounter for general adult medical examination without abnormal findings: Secondary | ICD-10-CM

## 2011-06-04 NOTE — Telephone Encounter (Signed)
Sept [facility] labs entered.  

## 2011-11-23 ENCOUNTER — Other Ambulatory Visit (INDEPENDENT_AMBULATORY_CARE_PROVIDER_SITE_OTHER): Payer: 59

## 2011-11-23 DIAGNOSIS — Z0000 Encounter for general adult medical examination without abnormal findings: Secondary | ICD-10-CM

## 2011-11-23 LAB — LIPID PANEL
Cholesterol: 197 mg/dL (ref 0–200)
LDL Cholesterol: 118 mg/dL — ABNORMAL HIGH (ref 0–99)
Total CHOL/HDL Ratio: 3
VLDL: 21.4 mg/dL (ref 0.0–40.0)

## 2011-11-23 LAB — HEPATIC FUNCTION PANEL
Alkaline Phosphatase: 96 U/L (ref 39–117)
Bilirubin, Direct: 0.2 mg/dL (ref 0.0–0.3)
Total Bilirubin: 1 mg/dL (ref 0.3–1.2)

## 2011-11-23 LAB — BASIC METABOLIC PANEL
BUN: 17 mg/dL (ref 6–23)
CO2: 26 mEq/L (ref 19–32)
Chloride: 107 mEq/L (ref 96–112)
Creatinine, Ser: 0.9 mg/dL (ref 0.4–1.2)
Glucose, Bld: 105 mg/dL — ABNORMAL HIGH (ref 70–99)
Potassium: 4.4 mEq/L (ref 3.5–5.1)

## 2011-11-23 LAB — CBC WITH DIFFERENTIAL/PLATELET
Eosinophils Relative: 3.4 % (ref 0.0–5.0)
HCT: 41.3 % (ref 36.0–46.0)
Hemoglobin: 13.9 g/dL (ref 12.0–15.0)
Lymphs Abs: 1.9 10*3/uL (ref 0.7–4.0)
MCV: 91.1 fl (ref 78.0–100.0)
Monocytes Absolute: 0.4 10*3/uL (ref 0.1–1.0)
Monocytes Relative: 8.7 % (ref 3.0–12.0)
Neutro Abs: 1.6 10*3/uL (ref 1.4–7.7)
Platelets: 338 10*3/uL (ref 150.0–400.0)
WBC: 4.1 10*3/uL — ABNORMAL LOW (ref 4.5–10.5)

## 2011-11-23 LAB — URINALYSIS, ROUTINE W REFLEX MICROSCOPIC
Bilirubin Urine: NEGATIVE
Ketones, ur: NEGATIVE
Nitrite: NEGATIVE
Urine Glucose: NEGATIVE
pH: 6.5 (ref 5.0–8.0)

## 2011-11-23 LAB — TSH: TSH: 2.69 u[IU]/mL (ref 0.35–5.50)

## 2011-11-24 ENCOUNTER — Ambulatory Visit (INDEPENDENT_AMBULATORY_CARE_PROVIDER_SITE_OTHER): Payer: 59 | Admitting: Internal Medicine

## 2011-11-24 ENCOUNTER — Encounter: Payer: Self-pay | Admitting: Internal Medicine

## 2011-11-24 VITALS — BP 110/78 | HR 80 | Temp 98.1°F | Resp 16 | Ht 64.5 in | Wt 175.0 lb

## 2011-11-24 DIAGNOSIS — Z2911 Encounter for prophylactic immunotherapy for respiratory syncytial virus (RSV): Secondary | ICD-10-CM

## 2011-11-24 DIAGNOSIS — D485 Neoplasm of uncertain behavior of skin: Secondary | ICD-10-CM

## 2011-11-24 DIAGNOSIS — Z0000 Encounter for general adult medical examination without abnormal findings: Secondary | ICD-10-CM

## 2011-11-24 DIAGNOSIS — N3090 Cystitis, unspecified without hematuria: Secondary | ICD-10-CM

## 2011-11-24 DIAGNOSIS — Z23 Encounter for immunization: Secondary | ICD-10-CM

## 2011-11-24 DIAGNOSIS — E785 Hyperlipidemia, unspecified: Secondary | ICD-10-CM

## 2011-11-24 DIAGNOSIS — K219 Gastro-esophageal reflux disease without esophagitis: Secondary | ICD-10-CM

## 2011-11-24 MED ORDER — CIPROFLOXACIN HCL 250 MG PO TABS: 250.0000 mg | ORAL_TABLET | Freq: Two times a day (BID) | ORAL | Status: DC

## 2011-11-24 NOTE — Assessment & Plan Note (Signed)
Continue with current prescription therapy as reflected on the Med list.  

## 2011-11-24 NOTE — Assessment & Plan Note (Signed)
Cipro x5 d 

## 2011-11-24 NOTE — Assessment & Plan Note (Addendum)
9/13 chronic - R foot bottom. She was checked by a dermatologist I suggested skin bx she would like to wait

## 2011-11-24 NOTE — Progress Notes (Signed)
   Subjective:    Patient ID: Kristy Singh, female    DOB: [birth year], 61 y.o.   MRN: [id number]  HPI  The patient is here for a wellness exam. The patient has been doing well overall without major physical or psychological issues going on lately. F/u dyslipidemia and GERD. She is on Crestor    Review of Systems  Constitutional: Negative for chills, activity change, appetite change, fatigue and unexpected weight change.  HENT: Negative for congestion, mouth sores and sinus pressure.   Eyes: Negative for visual disturbance.  Respiratory: Negative for cough and chest tightness.   Gastrointestinal: Negative for nausea and abdominal pain.  Genitourinary: Negative for frequency, difficulty urinating and vaginal pain.  Musculoskeletal: Negative for back pain and gait problem.  Skin: Negative for pallor and rash.  Neurological: Negative for dizziness, tremors, weakness, numbness and headaches.  Psychiatric/Behavioral: Negative for confusion and disturbed wake/sleep cycle.    Wt Readings from Last 3 Encounters:  11/24/11 175 lb (79.379 kg)  05/15/11 169 lb (76.658 kg)  08/29/10 169 lb (76.658 kg)   BP Readings from Last 3 Encounters:  11/24/11 110/78  05/15/11 120/80  08/29/10 130/90       Objective:   Physical Exam  Constitutional: She appears well-developed and well-nourished. No distress.  HENT:  Head: Normocephalic.  Right Ear: External ear normal.  Left Ear: External ear normal.  Nose: Nose normal.  Mouth/Throat: Oropharynx is clear and moist.  Eyes: Conjunctivae normal are normal. Pupils are equal, round, and reactive to light. Right eye exhibits no discharge. Left eye exhibits no discharge.  Neck: Normal range of motion. Neck supple. No JVD present. No tracheal deviation present. No thyromegaly present.  Cardiovascular: Normal rate, regular rhythm and normal heart sounds.   Pulmonary/Chest: No stridor. No respiratory distress. She has no wheezes.  Abdominal: Soft. Bowel  sounds are normal. She exhibits no distension and no mass. There is no tenderness. There is no rebound and no guarding.  Musculoskeletal: She exhibits no edema and no tenderness.  Lymphadenopathy:    She has no cervical adenopathy.  Neurological: She displays normal reflexes. No cranial nerve deficit. She exhibits normal muscle tone. Coordination normal.  Skin: No rash noted. No erythema.  Psychiatric: She has a normal mood and affect. Her behavior is normal. Judgment and thought content normal.  R foot sole 2x3 mm mole  EKG wnl     Lab Results  Component Value Date   WBC 4.1* 11/23/2011   HGB 13.9 11/23/2011   HCT 41.3 11/23/2011   PLT 338.0 11/23/2011   CHOL 197 11/23/2011   TRIG 107.0 11/23/2011   HDL 58.00 11/23/2011   LDLDIRECT 189.4 08/29/2010   ALT 22 11/23/2011   AST 21 11/23/2011   NA 140 11/23/2011   K 4.4 11/23/2011   CL 107 11/23/2011   CREATININE 0.9 11/23/2011   BUN 17 11/23/2011   CO2 26 11/23/2011   TSH 2.69 11/23/2011     Assessment & Plan:

## 2011-11-24 NOTE — Assessment & Plan Note (Addendum)
We discussed age appropriate health related issues, including available/recomended screening tests and vaccinations. We discussed a need for adhering to healthy diet and exercise. Labs/EKG were reviewed/ordered. All questions were answered.  tDAP Zostavax GYN q 12 mo

## 2011-12-04 ENCOUNTER — Other Ambulatory Visit: Payer: Self-pay | Admitting: Internal Medicine

## 2011-12-04 DIAGNOSIS — Z0000 Encounter for general adult medical examination without abnormal findings: Secondary | ICD-10-CM

## 2012-07-06 ENCOUNTER — Other Ambulatory Visit: Payer: Self-pay

## 2012-07-06 MED ORDER — ROSUVASTATIN CALCIUM 10 MG PO TABS: 10.0000 mg | ORAL_TABLET | Freq: Every day | ORAL | Status: DC

## 2012-07-06 NOTE — Telephone Encounter (Signed)
Pt called [facility] request rx refill [facility] Thanks

## 2012-11-14 ENCOUNTER — Other Ambulatory Visit (INDEPENDENT_AMBULATORY_CARE_PROVIDER_SITE_OTHER): Payer: 59

## 2012-11-14 DIAGNOSIS — Z0000 Encounter for general adult medical examination without abnormal findings: Secondary | ICD-10-CM

## 2012-11-14 LAB — CBC WITH DIFFERENTIAL/PLATELET
Basophils Absolute: 0 10*3/uL (ref 0.0–0.1)
Basophils Relative: 0.9 % (ref 0.0–3.0)
Eosinophils Absolute: 0.2 10*3/uL (ref 0.0–0.7)
Lymphocytes Relative: 44.4 % (ref 12.0–46.0)
MCHC: 34.7 g/dL (ref 30.0–36.0)
Monocytes Relative: 8.7 % (ref 3.0–12.0)
Neutrophils Relative %: 42.2 % — ABNORMAL LOW (ref 43.0–77.0)
RBC: 4.52 Mil/uL (ref 3.87–5.11)
WBC: 4.1 10*3/uL — ABNORMAL LOW (ref 4.5–10.5)

## 2012-11-14 LAB — BASIC METABOLIC PANEL
CO2: 28 mEq/L (ref 19–32)
Creatinine, Ser: 0.8 mg/dL (ref 0.4–1.2)
GFR: 75.56 mL/min (ref >60.00)
Glucose, Bld: 91 mg/dL (ref 70–99)
Potassium: 4.1 mEq/L (ref 3.5–5.1)
Sodium: 141 mEq/L (ref 135–145)

## 2012-11-14 LAB — URINALYSIS, ROUTINE W REFLEX MICROSCOPIC
Nitrite: NEGATIVE
Specific Gravity, Urine: 1.025 (ref 1.000–1.030)
Urobilinogen, UA: 0.2 (ref 0.0–1.0)

## 2012-11-14 LAB — LIPID PANEL
Cholesterol: 166 mg/dL (ref 0–200)
LDL Cholesterol: 87 mg/dL (ref 0–99)
Total CHOL/HDL Ratio: 3
VLDL: 18.4 mg/dL (ref 0.0–40.0)

## 2012-11-14 LAB — HEPATIC FUNCTION PANEL
AST: 19 U/L (ref 0–37)
Albumin: 4.2 g/dL (ref 3.5–5.2)
Total Bilirubin: 0.8 mg/dL (ref 0.3–1.2)
Total Protein: 6.9 g/dL (ref 6.0–8.3)

## 2012-11-25 ENCOUNTER — Ambulatory Visit (INDEPENDENT_AMBULATORY_CARE_PROVIDER_SITE_OTHER): Payer: 59 | Admitting: Internal Medicine

## 2012-11-25 ENCOUNTER — Encounter: Payer: Self-pay | Admitting: Internal Medicine

## 2012-11-25 VITALS — BP 120/80 | HR 60 | Temp 98.1°F | Resp 12 | Ht 64.0 in | Wt 180.0 lb

## 2012-11-25 DIAGNOSIS — Z0000 Encounter for general adult medical examination without abnormal findings: Secondary | ICD-10-CM

## 2012-11-25 DIAGNOSIS — D485 Neoplasm of uncertain behavior of skin: Secondary | ICD-10-CM

## 2012-11-25 DIAGNOSIS — E785 Hyperlipidemia, unspecified: Secondary | ICD-10-CM

## 2012-11-25 MED ORDER — ROSUVASTATIN CALCIUM 10 MG PO TABS: 10.0000 mg | ORAL_TABLET | Freq: Every day | ORAL | Status: DC

## 2012-11-25 NOTE — Assessment & Plan Note (Signed)
We discussed age appropriate health related issues, including available/recomended screening tests and vaccinations. We discussed a need for adhering to healthy diet and exercise. Labs/EKG were reviewed/ordered. All questions were answered.   

## 2012-11-25 NOTE — Assessment & Plan Note (Signed)
Continue with current prescription therapy as reflected on the Med list.  

## 2012-11-25 NOTE — Progress Notes (Signed)
   Subjective:    HPI  The patient is here for a wellness exam. The patient has been doing well overall without major physical or psychological issues going on lately.  F/u dyslipidemia and GERD. She is on Crestor    Review of Systems  Constitutional: Negative for chills, activity change, appetite change, fatigue and unexpected weight change.  HENT: Negative for congestion, mouth sores and sinus pressure.   Eyes: Negative for visual disturbance.  Respiratory: Negative for cough and chest tightness.   Gastrointestinal: Negative for nausea and abdominal pain.  Genitourinary: Negative for frequency, difficulty urinating and vaginal pain.  Musculoskeletal: Negative for back pain and gait problem.  Skin: Negative for pallor and rash.  Neurological: Negative for dizziness, tremors, weakness, numbness and headaches.  Psychiatric/Behavioral: Negative for confusion and sleep disturbance.    Wt Readings from Last 3 Encounters:  11/25/12 180 lb (81.647 kg)  11/24/11 175 lb (79.379 kg)  05/15/11 169 lb (76.658 kg)   BP Readings from Last 3 Encounters:  11/25/12 120/80  11/24/11 110/78  05/15/11 120/80       Objective:   Physical Exam  Constitutional: She appears well-developed and well-nourished. No distress.  HENT:  Head: Normocephalic.  Right Ear: External ear normal.  Left Ear: External ear normal.  Nose: Nose normal.  Mouth/Throat: Oropharynx is clear and moist.  Eyes: Conjunctivae are normal. Pupils are equal, round, and reactive to light. Right eye exhibits no discharge. Left eye exhibits no discharge.  Neck: Normal range of motion. Neck supple. No JVD present. No tracheal deviation present. No thyromegaly present.  Cardiovascular: Normal rate, regular rhythm and normal heart sounds.   Pulmonary/Chest: No stridor. No respiratory distress. She has no wheezes.  Abdominal: Soft. Bowel sounds are normal. She exhibits no distension and no mass. There is no tenderness. There is  no rebound and no guarding.  Musculoskeletal: She exhibits no edema and no tenderness.  Lymphadenopathy:    She has no cervical adenopathy.  Neurological: She displays normal reflexes. No cranial nerve deficit. She exhibits normal muscle tone. Coordination normal.  Skin: No rash noted. No erythema.  Psychiatric: She has a normal mood and affect. Her behavior is normal. Judgment and thought content normal.  R foot sole 2x3 mm mole  EKG wnl     Lab Results  Component Value Date   WBC 4.1* 11/14/2012   HGB 14.0 11/14/2012   HCT 40.4 11/14/2012   PLT 311.0 11/14/2012   CHOL 166 11/14/2012   TRIG 92.0 11/14/2012   HDL 60.90 11/14/2012   LDLDIRECT 189.4 08/29/2010   ALT 19 11/14/2012   AST 19 11/14/2012   NA 141 11/14/2012   K 4.1 11/14/2012   CL 108 11/14/2012   CREATININE 0.8 11/14/2012   BUN 18 11/14/2012   CO2 28 11/14/2012   TSH 1.79 11/14/2012     Assessment & Plan:

## 2012-11-25 NOTE — Assessment & Plan Note (Signed)
No change 

## 2013-11-28 ENCOUNTER — Other Ambulatory Visit (INDEPENDENT_AMBULATORY_CARE_PROVIDER_SITE_OTHER): Payer: BC Managed Care – PPO

## 2013-11-28 ENCOUNTER — Ambulatory Visit (INDEPENDENT_AMBULATORY_CARE_PROVIDER_SITE_OTHER): Payer: BC Managed Care – PPO | Admitting: Internal Medicine

## 2013-11-28 ENCOUNTER — Encounter: Payer: Self-pay | Admitting: Internal Medicine

## 2013-11-28 VITALS — BP 120/76 | HR 54 | Temp 97.9°F | Resp 18 | Ht 64.5 in | Wt 187.8 lb

## 2013-11-28 DIAGNOSIS — Z23 Encounter for immunization: Secondary | ICD-10-CM

## 2013-11-28 DIAGNOSIS — Z0000 Encounter for general adult medical examination without abnormal findings: Secondary | ICD-10-CM

## 2013-11-28 DIAGNOSIS — R635 Abnormal weight gain: Secondary | ICD-10-CM

## 2013-11-28 DIAGNOSIS — E785 Hyperlipidemia, unspecified: Secondary | ICD-10-CM

## 2013-11-28 LAB — CBC WITH DIFFERENTIAL/PLATELET
BASOS ABS: 0 10*3/uL (ref 0.0–0.1)
Basophils Relative: 1 % (ref 0.0–3.0)
Eosinophils Absolute: 0.2 10*3/uL (ref 0.0–0.7)
Eosinophils Relative: 3.6 % (ref 0.0–5.0)
HEMATOCRIT: 43.3 % (ref 36.0–46.0)
Hemoglobin: 14.8 g/dL (ref 12.0–15.0)
LYMPHS ABS: 2.1 10*3/uL (ref 0.7–4.0)
Lymphocytes Relative: 45.6 % (ref 12.0–46.0)
MCHC: 34.2 g/dL (ref 30.0–36.0)
MCV: 90.9 fl (ref 78.0–100.0)
MONO ABS: 0.3 10*3/uL (ref 0.1–1.0)
MONOS PCT: 7 % (ref 3.0–12.0)
NEUTROS ABS: 2 10*3/uL (ref 1.4–7.7)
Neutrophils Relative %: 42.8 % — ABNORMAL LOW (ref 43.0–77.0)
Platelets: 333 10*3/uL (ref 150.0–400.0)
RBC: 4.76 Mil/uL (ref 3.87–5.11)
RDW: 12.5 % (ref 11.5–15.5)
WBC: 4.6 10*3/uL (ref 4.0–10.5)

## 2013-11-28 LAB — URINALYSIS, ROUTINE W REFLEX MICROSCOPIC
BILIRUBIN URINE: NEGATIVE
Hgb urine dipstick: NEGATIVE
KETONES UR: NEGATIVE
Nitrite: NEGATIVE
RBC / HPF: NONE SEEN (ref 0–?)
Specific Gravity, Urine: <=1.005 — AB (ref 1.000–1.030)
Total Protein, Urine: NEGATIVE
Urine Glucose: NEGATIVE
Urobilinogen, UA: 0.2 (ref 0.0–1.0)
pH: 6 (ref 5.0–8.0)

## 2013-11-28 MED ORDER — ROSUVASTATIN CALCIUM 10 MG PO TABS: 10.0000 mg | ORAL_TABLET | Freq: Every day | ORAL | Status: DC

## 2013-11-28 NOTE — Progress Notes (Signed)
   Subjective:    HPI  The patient is here for a wellness exam. The patient has been doing well overall without major physical or psychological issues going on lately.  F/u dyslipidemia and GERD. She is on Crestor    Review of Systems  Constitutional: Negative for chills, activity change, appetite change, fatigue and unexpected weight change.  HENT: Negative for congestion, mouth sores and sinus pressure.   Eyes: Negative for visual disturbance.  Respiratory: Negative for cough and chest tightness.   Gastrointestinal: Negative for nausea and abdominal pain.  Genitourinary: Negative for frequency, difficulty urinating and vaginal pain.  Musculoskeletal: Negative for back pain and gait problem.  Skin: Negative for pallor and rash.  Neurological: Negative for dizziness, tremors, weakness, numbness and headaches.  Psychiatric/Behavioral: Negative for confusion and sleep disturbance.    Wt Readings from Last 3 Encounters:  11/28/13 187 lb 12.8 oz (85.186 kg)  11/25/12 180 lb (81.647 kg)  11/24/11 175 lb (79.379 kg)   BP Readings from Last 3 Encounters:  11/28/13 120/76  11/25/12 120/80  11/24/11 110/78       Objective:   Physical Exam  Constitutional: She appears well-developed and well-nourished. No distress.  HENT:  Head: Normocephalic.  Right Ear: External ear normal.  Left Ear: External ear normal.  Nose: Nose normal.  Mouth/Throat: Oropharynx is clear and moist.  Eyes: Conjunctivae are normal. Pupils are equal, round, and reactive to light. Right eye exhibits no discharge. Left eye exhibits no discharge.  Neck: Normal range of motion. Neck supple. No JVD present. No tracheal deviation present. No thyromegaly present.  Cardiovascular: Normal rate, regular rhythm and normal heart sounds.   Pulmonary/Chest: No stridor. No respiratory distress. She has no wheezes.  Abdominal: Soft. Bowel sounds are normal. She exhibits no distension and no mass. There is no tenderness.  There is no rebound and no guarding.  Musculoskeletal: She exhibits no edema and no tenderness.  Lymphadenopathy:    She has no cervical adenopathy.  Neurological: She displays normal reflexes. No cranial nerve deficit. She exhibits normal muscle tone. Coordination normal.  Skin: No rash noted. No erythema.  Psychiatric: She has a normal mood and affect. Her behavior is normal. Judgment and thought content normal.  R foot sole 2x3 mm mole  EKG wnl     Lab Results  Component Value Date   WBC 4.1* 11/14/2012   HGB 14.0 11/14/2012   HCT 40.4 11/14/2012   PLT 311.0 11/14/2012   CHOL 166 11/14/2012   TRIG 92.0 11/14/2012   HDL 60.90 11/14/2012   LDLDIRECT 189.4 08/29/2010   ALT 19 11/14/2012   AST 19 11/14/2012   NA 141 11/14/2012   K 4.1 11/14/2012   CL 108 11/14/2012   CREATININE 0.8 11/14/2012   BUN 18 11/14/2012   CO2 28 11/14/2012   TSH 1.79 11/14/2012     Assessment & Plan:

## 2013-11-28 NOTE — Progress Notes (Signed)
Pre visit review using our clinic review tool, if applicable. No additional management support is needed unless otherwise documented below in the visit note. 

## 2013-11-28 NOTE — Assessment & Plan Note (Signed)
Discussed.

## 2013-11-28 NOTE — Assessment & Plan Note (Signed)
Continue with current prescription therapy as reflected on the Med list.  

## 2013-11-28 NOTE — Assessment & Plan Note (Signed)
We discussed age appropriate health related issues, including available/recomended screening tests and vaccinations. We discussed a need for adhering to healthy diet and exercise. Labs/EKG were reviewed/ordered. All questions were answered.   

## 2013-11-29 LAB — LIPID PANEL
Cholesterol: 171 mg/dL (ref 0–200)
HDL: 52 mg/dL (ref >39.00)
LDL Cholesterol: 93 mg/dL (ref 0–99)
NONHDL: 119
Total CHOL/HDL Ratio: 3
Triglycerides: 128 mg/dL (ref 0.0–149.0)
VLDL: 25.6 mg/dL (ref 0.0–40.0)

## 2013-11-29 LAB — HEPATIC FUNCTION PANEL
ALT: 29 U/L (ref 0–35)
AST: 25 U/L (ref 0–37)
Albumin: 4.1 g/dL (ref 3.5–5.2)
Alkaline Phosphatase: 89 U/L (ref 39–117)
BILIRUBIN DIRECT: 0.1 mg/dL (ref 0.0–0.3)
BILIRUBIN TOTAL: 0.7 mg/dL (ref 0.2–1.2)
Total Protein: 7.6 g/dL (ref 6.0–8.3)

## 2013-11-29 LAB — BASIC METABOLIC PANEL
BUN: 15 mg/dL (ref 6–23)
CHLORIDE: 108 meq/L (ref 96–112)
CO2: 25 mEq/L (ref 19–32)
Calcium: 9.8 mg/dL (ref 8.4–10.5)
Creatinine, Ser: 0.8 mg/dL (ref 0.4–1.2)
GFR: 74.25 mL/min (ref >60.00)
Glucose, Bld: 75 mg/dL (ref 70–99)
Potassium: 4.8 mEq/L (ref 3.5–5.1)
SODIUM: 143 meq/L (ref 135–145)

## 2013-11-29 LAB — TSH: TSH: 2.47 u[IU]/mL (ref 0.35–4.50)

## 2014-05-11 ENCOUNTER — Other Ambulatory Visit: Payer: Self-pay | Admitting: Internal Medicine

## 2014-05-29 ENCOUNTER — Encounter: Payer: Self-pay | Admitting: Family

## 2014-05-29 ENCOUNTER — Ambulatory Visit (INDEPENDENT_AMBULATORY_CARE_PROVIDER_SITE_OTHER): Payer: BLUE CROSS/BLUE SHIELD | Admitting: Family

## 2014-05-29 VITALS — BP 120/88 | HR 65 | Temp 97.9°F | Resp 18 | Ht 64.5 in | Wt 186.1 lb

## 2014-05-29 DIAGNOSIS — J069 Acute upper respiratory infection, unspecified: Secondary | ICD-10-CM

## 2014-05-29 NOTE — Assessment & Plan Note (Signed)
Symptoms and exam consistent with acute upper respiratory infection. Continue current over-the-counter medications as needed for symptom relief and supportive care. Follow-up in the next 3-4 days if symptoms worsen or fail to improve.

## 2014-05-29 NOTE — Patient Instructions (Signed)
Thank you for choosing [facility].  Summary/Instructions:  If your symptoms worsen or fail to improve, please contact our office for further instruction, or in case of emergency go directly to the emergency room at the closest medical facility.   General Recommendations:    Please drink plenty of fluids.  Get plenty of rest   Sleep in humidified air  Use saline nasal sprays  Netti pot   OTC Medications:  Decongestants - helps relieve congestion   Flonase (generic fluticasone) or Nasacort (generic triamcinolone) - please make sure to use the "cross-over" technique at a 45 degree angle towards the opposite eye as opposed to straight up the nasal passageway.   Sudafed (generic pseudoephedrine - Note this is the one that is available behind the pharmacy counter); Products with phenylephrine (-PE) may also be used but is often not as effective as pseudoephedrine.   If you have HIGH BLOOD PRESSURE - Coricidin HBP; AVOID any product that is -D as this contains pseudoephedrine which may increase your blood pressure.  Afrin (oxymetazoline) every 6-8 hours for up to 3 days.   Allergies - helps relieve runny nose, itchy eyes and sneezing   Claritin (generic loratidine), Allegra (fexofenidine), or Zyrtec (generic cyrterizine) for runny nose. These medications should not cause drowsiness.  Note - Benadryl (generic diphenhydramine) may be used however may cause drowsiness  Cough -   Delsym or Robitussin (generic dextromethorphan)  Expectorants - helps loosen mucus to ease removal   Mucinex (generic guaifenesin) as directed on the package.  Headaches / General Aches   Tylenol (generic acetaminophen) - DO NOT EXCEED 3 grams (3,000 mg) in a 24 hour time period  Advil/Motrin (generic ibuprofen)   Sore Throat -   Salt water gargle   Chloraseptic (generic benzocaine) spray or lozenges / Sucrets (generic dyclonine)

## 2014-05-29 NOTE — Progress Notes (Signed)
Pre visit review using our clinic review tool, if applicable. No additional management support is needed unless otherwise documented below in the visit note. 

## 2014-05-29 NOTE — Progress Notes (Signed)
   Subjective:    Patient ID: Kristy Singh, female    DOB: 01-07-1951, 64 y.o.   MRN: [id number]  Chief Complaint  Patient presents with  . Nasal Congestion    x2 days congestion, drainage, productive cough mainly in the mornings, says it has moved to chest    HPI:  Kristy Singh is a 64 y.o. female who presents today for an acute visit.  This is a new problem. Associated symptoms of congestion, poductive cough and chest tightness has been going on for about 2 days. Timing of symptoms is worse at night with drainage and cough. Mofdifying factors include steam in the shower and Mucinex. Denies recent antibiotic use.   No Known Allergies   Current Outpatient Prescriptions on File Prior to Visit  Medication Sig Dispense Refill  . cetirizine (ZYRTEC) 10 MG tablet Take 10 mg by mouth daily.    . Cholecalciferol (VITAMIN D3) 1000 UNITS tablet Take 1,000 Units by mouth daily.      Marland Kitchen ibuprofen (ADVIL,MOTRIN) 600 MG tablet Take 600 mg by mouth as needed.      . rosuvastatin (CRESTOR) 10 MG tablet Take 1 tablet (10 mg total) by mouth daily. 90 tablet 3   No current facility-administered medications on file prior to visit.    Review of Systems  Constitutional: Positive for chills. Negative for fever.  HENT: Positive for congestion.   Respiratory: Positive for cough and chest tightness.       Objective:    BP 120/88 mmHg  Pulse 65  Temp(Src) 97.9 F (36.6 C) (Oral)  Resp 18  Ht 5' 4.5" (1.638 m)  Wt 186 lb 1.9 oz (84.423 kg)  BMI 31.47 kg/m2  SpO2 98% Nursing note and vital signs reviewed.  Physical Exam  Constitutional: She is oriented to person, place, and time. She appears well-developed and well-nourished. No distress.  HENT:  Right Ear: Hearing, tympanic membrane, external ear and ear canal normal.  Left Ear: Hearing, tympanic membrane, external ear and ear canal normal.  Nose: Nose normal. Right sinus exhibits no maxillary sinus tenderness and no frontal sinus tenderness.  Left sinus exhibits no maxillary sinus tenderness and no frontal sinus tenderness.  Mouth/Throat: Uvula is midline, oropharynx is clear and moist and mucous membranes are normal.  Cardiovascular: Normal rate, regular rhythm, normal heart sounds and intact distal pulses.   Pulmonary/Chest: Effort normal and breath sounds normal.  Neurological: She is alert and oriented to person, place, and time.  Skin: Skin is warm and dry.  Psychiatric: She has a normal mood and affect. Her behavior is normal. Judgment and thought content normal.       Assessment & Plan:

## 2014-06-09 LAB — HM MAMMOGRAPHY

## 2014-06-20 LAB — HM MAMMOGRAPHY

## 2014-09-04 ENCOUNTER — Telehealth: Payer: Self-pay | Admitting: Internal Medicine

## 2014-09-04 NOTE — Telephone Encounter (Signed)
Called patient to see if a recent mammogram has been done, pt did not answer both phones, left voice messages.

## 2014-09-05 ENCOUNTER — Telehealth: Payer: Self-pay | Admitting: Internal Medicine

## 2014-09-05 NOTE — Telephone Encounter (Signed)
Anna/cma has been advised, anna to call dr mark anderson's office to see if mammogram report can be sent so we can abstract

## 2014-09-05 NOTE — Telephone Encounter (Signed)
Patient states she has had a recent mammogram with [name] back on April the 19th.

## 2014-09-05 NOTE — Telephone Encounter (Addendum)
Called patient to see if a recent mammogram has been done, patient states that she has had one already done at [facility], [name] on June 12, 2014, contacted them to get results and left a message for them to contact us back.

## 2014-09-11 ENCOUNTER — Encounter: Payer: Self-pay | Admitting: Internal Medicine

## 2014-09-26 ENCOUNTER — Encounter: Payer: Self-pay | Admitting: *Deleted

## 2014-11-30 ENCOUNTER — Ambulatory Visit (INDEPENDENT_AMBULATORY_CARE_PROVIDER_SITE_OTHER): Payer: BLUE CROSS/BLUE SHIELD | Admitting: Internal Medicine

## 2014-11-30 ENCOUNTER — Encounter: Payer: Self-pay | Admitting: Internal Medicine

## 2014-11-30 VITALS — BP 134/80 | HR 63 | Ht 64.5 in | Wt 175.0 lb

## 2014-11-30 DIAGNOSIS — Z23 Encounter for immunization: Secondary | ICD-10-CM | POA: Diagnosis not present

## 2014-11-30 DIAGNOSIS — Z0000 Encounter for general adult medical examination without abnormal findings: Secondary | ICD-10-CM

## 2014-11-30 NOTE — Progress Notes (Signed)
   Subjective:  Patient ID: Kristy Singh, female    DOB: [birth year]  Age: 64 y.o. MRN: [id number]  CC: Annual Exam   HPI Kristy Singh presents for a well exam. Pt had labs at work  In April - all nl.  Outpatient Prescriptions Prior to Visit  Medication Sig Dispense Refill  . cetirizine (ZYRTEC) 10 MG tablet Take 10 mg by mouth daily.    . Cholecalciferol (VITAMIN D3) 1000 UNITS tablet Take 1,000 Units by mouth daily.      Marland Kitchen ibuprofen (ADVIL,MOTRIN) 600 MG tablet Take 600 mg by mouth as needed.      . rosuvastatin (CRESTOR) 10 MG tablet Take 1 tablet (10 mg total) by mouth daily. 90 tablet 3   No facility-administered medications prior to visit.    ROS Review of Systems  Constitutional: Negative for chills, activity change, appetite change, fatigue and unexpected weight change.  HENT: Negative for congestion, mouth sores and sinus pressure.   Eyes: Negative for visual disturbance.  Respiratory: Negative for cough and chest tightness.   Gastrointestinal: Negative for nausea and abdominal pain.  Genitourinary: Negative for frequency, difficulty urinating and vaginal pain.  Musculoskeletal: Negative for back pain and gait problem.  Skin: Negative for pallor and rash.  Neurological: Negative for dizziness, tremors, weakness, numbness and headaches.  Psychiatric/Behavioral: Negative for confusion and sleep disturbance.    Objective:  BP 134/80 mmHg  Pulse 63  Ht 5' 4.5" (1.638 m)  Wt 175 lb (79.379 kg)  BMI 29.59 kg/m2  SpO2 98%  BP Readings from Last 3 Encounters:  11/30/14 134/80  05/29/14 120/88  11/28/13 120/76    Wt Readings from Last 3 Encounters:  11/30/14 175 lb (79.379 kg)  05/29/14 186 lb 1.9 oz (84.423 kg)  11/28/13 187 lb 12.8 oz (85.186 kg)    Physical Exam  Lab Results  Component Value Date   WBC 4.6 11/28/2013   HGB 14.8 11/28/2013   HCT 43.3 11/28/2013   PLT 333.0 11/28/2013   GLUCOSE 75 11/28/2013   CHOL 171 11/28/2013   TRIG 128.0 11/28/2013     HDL 52.00 11/28/2013   LDLDIRECT 189.4 08/29/2010   LDLCALC 93 11/28/2013   ALT 29 11/28/2013   AST 25 11/28/2013   NA 143 11/28/2013   K 4.8 11/28/2013   CL 108 11/28/2013   CREATININE 0.8 11/28/2013   BUN 15 11/28/2013   CO2 25 11/28/2013   TSH 2.47 11/28/2013    No results found.  Assessment & Plan:   Kristy Singh was seen today for annual exam.  Diagnoses and all orders for this visit:  Need for influenza vaccination -     Flu Vaccine QUAD 36+ mos IM   I am having Kristy Singh maintain her cholecalciferol, ibuprofen, cetirizine, and rosuvastatin.  No orders of the defined types were placed in this encounter.     Follow-up: No Follow-up on file.  Walker Kehr, MD

## 2014-11-30 NOTE — Progress Notes (Signed)
Pre visit review using our clinic review tool, if applicable. No additional management support is needed unless otherwise documented below in the visit note. 

## 2014-11-30 NOTE — Assessment & Plan Note (Addendum)
We discussed age appropriate health related issues, including available/recomended screening tests and vaccinations. We discussed a need for adhering to healthy diet and exercise. Labs/EKG were reviewed/ordered. All questions were answered. Pt had labs at work  In April - all nl. Declined another lab work. Colon due in 2019

## 2015-02-24 DIAGNOSIS — Z923 Personal history of irradiation: Secondary | ICD-10-CM

## 2015-02-24 DIAGNOSIS — C50919 Malignant neoplasm of unspecified site of unspecified female breast: Secondary | ICD-10-CM

## 2015-02-24 HISTORY — DX: Personal history of irradiation: Z92.3

## 2015-02-24 HISTORY — PX: BREAST LUMPECTOMY: SHX2

## 2015-02-24 HISTORY — DX: Malignant neoplasm of unspecified site of unspecified female breast: C50.919

## 2015-05-30 ENCOUNTER — Other Ambulatory Visit: Payer: Self-pay | Admitting: Internal Medicine

## 2015-06-24 DIAGNOSIS — C50919 Malignant neoplasm of unspecified site of unspecified female breast: Secondary | ICD-10-CM | POA: Insufficient documentation

## 2015-06-24 DIAGNOSIS — E7800 Pure hypercholesterolemia, unspecified: Secondary | ICD-10-CM | POA: Insufficient documentation

## 2015-06-28 ENCOUNTER — Other Ambulatory Visit: Payer: Self-pay | Admitting: Obstetrics and Gynecology

## 2015-06-28 ENCOUNTER — Other Ambulatory Visit: Payer: Self-pay | Admitting: Anesthesiology

## 2015-06-28 DIAGNOSIS — R928 Other abnormal and inconclusive findings on diagnostic imaging of breast: Secondary | ICD-10-CM

## 2015-07-04 ENCOUNTER — Other Ambulatory Visit: Payer: Self-pay | Admitting: Obstetrics and Gynecology

## 2015-07-04 ENCOUNTER — Ambulatory Visit
Admission: RE | Admit: 2015-07-04 | Discharge: 2015-07-04 | Disposition: A | Discharge status: Home / Self Care | Payer: BLUE CROSS/BLUE SHIELD | Source: Ambulatory Visit | Attending: Obstetrics and Gynecology | Admitting: Obstetrics and Gynecology

## 2015-07-04 DIAGNOSIS — R928 Other abnormal and inconclusive findings on diagnostic imaging of breast: Secondary | ICD-10-CM

## 2015-07-04 DIAGNOSIS — N6310 Unspecified lump in the right breast, unspecified quadrant: Secondary | ICD-10-CM

## 2015-07-05 ENCOUNTER — Ambulatory Visit
Admission: RE | Admit: 2015-07-05 | Discharge: 2015-07-05 | Disposition: A | Discharge status: Home / Self Care | Payer: BLUE CROSS/BLUE SHIELD | Source: Ambulatory Visit | Attending: Obstetrics and Gynecology | Admitting: Obstetrics and Gynecology

## 2015-07-05 DIAGNOSIS — N6310 Unspecified lump in the right breast, unspecified quadrant: Secondary | ICD-10-CM

## 2015-07-12 ENCOUNTER — Other Ambulatory Visit: Payer: Self-pay | Admitting: General Surgery

## 2015-07-12 DIAGNOSIS — C50211 Malignant neoplasm of upper-inner quadrant of right female breast: Secondary | ICD-10-CM

## 2015-07-15 ENCOUNTER — Telehealth: Payer: Self-pay | Admitting: Hematology and Oncology

## 2015-07-15 ENCOUNTER — Encounter: Payer: Self-pay | Admitting: Radiation Oncology

## 2015-07-15 ENCOUNTER — Encounter: Payer: Self-pay | Admitting: Hematology and Oncology

## 2015-07-15 NOTE — Telephone Encounter (Signed)
Telephone to patient to schedule new patient appointment, 5/23 @3 :45pm with [facility]. Patient agreed to appointment date and time. Demographics was confirmed and directions was given to patient. Letter faxed to referring provider. Message to navigators sent.

## 2015-07-16 ENCOUNTER — Ambulatory Visit (HOSPITAL_BASED_OUTPATIENT_CLINIC_OR_DEPARTMENT_OTHER): Payer: BLUE CROSS/BLUE SHIELD | Admitting: Hematology and Oncology

## 2015-07-16 ENCOUNTER — Encounter: Payer: Self-pay | Admitting: *Deleted

## 2015-07-16 ENCOUNTER — Telehealth: Payer: Self-pay | Admitting: *Deleted

## 2015-07-16 ENCOUNTER — Encounter: Payer: Self-pay | Admitting: Hematology and Oncology

## 2015-07-16 VITALS — BP 130/73 | HR 72 | Temp 98.1°F | Resp 18 | Ht 64.5 in | Wt 182.9 lb

## 2015-07-16 DIAGNOSIS — C50211 Malignant neoplasm of upper-inner quadrant of right female breast: Secondary | ICD-10-CM | POA: Diagnosis not present

## 2015-07-16 NOTE — Progress Notes (Signed)
[facility] CONSULT NOTE  Patient Care Team: Cassandria Anger, MD as PCP - General [name], MD as Consulting Physician (Obstetrics and Gynecology)  CHIEF COMPLAINTS/PURPOSE OF CONSULTATION:  Newly diagnosed breast cancer  HISTORY OF PRESENTING ILLNESS:  Kristy Singh 66 y.o. female is here because of recent diagnosis of right breast cancer. Patient had a routine screening mammogram, she requested a 3-D mammogram which revealed abnormality in the right breast at 12:30 position measuring 7 mm in size. 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 She was seen by Dr. [name] who referred her to [place] for discussion regarding adjuvant treatment options. She is here by herself. She reports no pain or discomfort in the breast.  I reviewed her records extensively and collaborated the history with the patient.  SUMMARY OF ONCOLOGIC HISTORY:   Breast cancer of upper-inner quadrant of right female breast ([place])   07/04/2015 Mammogram Right breast mass 7 mm 12:30 position   07/05/2015 Initial Diagnosis Right breast biopsy 12:30 position: Invasive ductal carcinoma grade 2, ER 100%, PR 95%, HER-2 negative ratio 1.29, K 67 15%, T1 BN 0 stage IA    MEDICAL HISTORY:  Past Medical History  Diagnosis Date  . GERD (gastroesophageal reflux disease)   . Hyperlipidemia   . Gallstones   . Kidney stones     Dr Dorina Hoyer    SURGICAL HISTORY: Past Surgical History  Procedure Laterality Date  . Cholecystectomy  2008    Dr. Autumn Messing  in [place], had ERCP post -op     SOCIAL HISTORY: Social History   Social History  . Marital Status: Married    Spouse Name: N/A  . Number of Children: N/A  . Years of Education: N/A   Occupational History  . computers    Social History Main Topics  . Smoking status: Never Smoker   . Smokeless tobacco: Not on file  . Alcohol Use: No   . Drug Use: No  . Sexual Activity: Yes   Other Topics Concern  . Not on file   Social History Narrative    FAMILY HISTORY: Family History  Problem Relation Age of Onset  . Hypertension Other   . Hypertension Mother   . Hyperlipidemia Mother   . Cancer Mother 34    brest  . Arthritis Father     ALLERGIES:  has No Known Allergies.  MEDICATIONS:  Current Outpatient Prescriptions  Medication Sig Dispense Refill  . cetirizine (ZYRTEC) 10 MG tablet Take 10 mg by mouth daily.    . Cholecalciferol (VITAMIN D3) 1000 UNITS tablet Take 1,000 Units by mouth daily.      Marland Kitchen ibuprofen (ADVIL,MOTRIN) 600 MG tablet Take 600 mg by mouth as needed.      . rosuvastatin (CRESTOR) 10 MG tablet TAKE 1 TABLET DAILY 90 tablet 1   No current facility-administered medications for this visit.    REVIEW OF SYSTEMS:   Constitutional: Denies fevers, chills or abnormal night sweats Eyes: Denies blurriness of vision, double vision or watery eyes Ears, nose, mouth, throat, and face: Denies mucositis or sore throat Respiratory: Denies cough, dyspnea or wheezes Cardiovascular: Denies palpitation, chest discomfort or lower extremity swelling Gastrointestinal:  Denies nausea, heartburn or change in bowel habits Skin: Denies abnormal skin rashes Lymphatics: Denies new lymphadenopathy or easy bruising Neurological:Denies numbness, tingling or new weaknesses Behavioral/Psych: Mood is stable, no new changes  Breast:  Denies any palpable lumps or discharge All other systems were reviewed with the patient and are negative.  PHYSICAL EXAMINATION: ECOG PERFORMANCE STATUS: 0 - Asymptomatic  Filed Vitals:   07/16/15 1532  BP: 130/73  Pulse: 72  Temp: 98.1 F (36.7 C)  Resp: 18   Filed Weights   07/16/15 1532  Weight: 182 lb 14.4 oz (82.963 kg)    GENERAL:alert, no distress and comfortable SKIN: skin color, texture, turgor are normal, no rashes or significant lesions EYES: normal, conjunctiva are  pink and non-injected, sclera clear OROPHARYNX:no exudate, no erythema and lips, buccal mucosa, and tongue normal  NECK: supple, thyroid normal size, non-tender, without nodularity LYMPH:  no palpable lymphadenopathy in the cervical, axillary or inguinal LUNGS: clear to auscultation and percussion with normal breathing effort HEART: regular rate & rhythm and no murmurs and no lower extremity edema ABDOMEN:abdomen soft, non-tender and normal bowel sounds Musculoskeletal:no cyanosis of digits and no clubbing  PSYCH: alert & oriented x 3 with fluent speech NEURO: no focal motor/sensory deficits BREAST: No palpable nodules in breast. No palpable axillary or supraclavicular lymphadenopathy (exam performed in the presence of a chaperone)   LABORATORY DATA:  I have reviewed the data as listed Lab Results  Component Value Date   WBC 4.6 11/28/2013   HGB 14.8 11/28/2013   HCT 43.3 11/28/2013   MCV 90.9 11/28/2013   PLT 333.0 11/28/2013   Lab Results  Component Value Date   NA 143 11/28/2013   K 4.8 11/28/2013   CL 108 11/28/2013   CO2 25 11/28/2013    RADIOGRAPHIC STUDIES: I have personally reviewed the radiological reports and agreed with the findings in the report.  ASSESSMENT AND PLAN:  Breast cancer of upper-inner quadrant of right female breast ([place]) Right breast biopsy 07/05/2015: 12:30 position: Invasive ductal carcinoma grade 2, ER 100%, PR 95%, HER-2 negative ratio 1.29, K 67 15%, T1 BN 0 stage IA Right mammogram revealed a 7 mm mass at 12:30 position  Pathology and radiology counseling:Discussed with the patient, the details of pathology including the type of breast cancer,the clinical staging, the significance of ER, PR and HER-2/neu receptors and the implications for treatment. After reviewing the pathology in detail, we proceeded to discuss the different treatment options between surgery, radiation, chemotherapy, antiestrogen therapies.  Recommendations: 1. Breast  conserving surgery followed by 2. Oncotype DX testing to determine if chemotherapy would be of any benefit followed by 3. Adjuvant radiation therapy followed by 4. Adjuvant antiestrogen therapy  Oncotype counseling: I discussed Oncotype DX test. I explained to the patient that this is a 21 gene panel to evaluate patient tumors DNA to calculate recurrence score. This would help determine whether patient has high risk or intermediate risk or low risk breast cancer. She understands that if her tumor was found to be high risk, she would benefit from systemic chemotherapy. If low risk, no need of chemotherapy. If she was found to be intermediate risk, we would need to evaluate the score as well as other risk factors and determine if an abbreviated chemotherapy may be of benefit.  Return to clinic after surgery to discuss final pathology report and then determine if Oncotype DX testing will need to be sent.  All questions were answered. The patient knows to call the clinic with any problems, questions or concerns.    Rulon Eisenmenger, MD 07/16/2015

## 2015-07-16 NOTE — Telephone Encounter (Signed)
Received appt date/time from Andrea.  Placed a note for an intake form to be given to the pt at time of check in. 

## 2015-07-16 NOTE — Assessment & Plan Note (Signed)
Right breast biopsy 07/05/2015: 12:30 position: Invasive ductal carcinoma grade 2, ER 100%, PR 95%, HER-2 negative ratio 1.29, K 67 15%, T1 BN 0 stage IA Right mammogram revealed a 7 mm mass at 12:30 position  Pathology and radiology counseling:Discussed with the patient, the details of pathology including the type of breast cancer,the clinical staging, the significance of ER, PR and HER-2/neu receptors and the implications for treatment. After reviewing the pathology in detail, we proceeded to discuss the different treatment options between surgery, radiation, chemotherapy, antiestrogen therapies.  Recommendations: 1. Breast conserving surgery followed by 2. Oncotype DX testing to determine if chemotherapy would be of any benefit followed by 3. Adjuvant radiation therapy followed by 4. Adjuvant antiestrogen therapy  Oncotype counseling: I discussed Oncotype DX test. I explained to the patient that this is a 21 gene panel to evaluate patient tumors DNA to calculate recurrence score. This would help determine whether patient has high risk or intermediate risk or low risk breast cancer. She understands that if her tumor was found to be high risk, she would benefit from systemic chemotherapy. If low risk, no need of chemotherapy. If she was found to be intermediate risk, we would need to evaluate the score as well as other risk factors and determine if an abbreviated chemotherapy may be of benefit.  Return to clinic after surgery to discuss final pathology report and then determine if Oncotype DX testing will need to be sent.

## 2015-07-18 ENCOUNTER — Other Ambulatory Visit: Payer: Self-pay | Admitting: General Surgery

## 2015-07-18 DIAGNOSIS — C50211 Malignant neoplasm of upper-inner quadrant of right female breast: Secondary | ICD-10-CM

## 2015-07-19 ENCOUNTER — Other Ambulatory Visit: Payer: Self-pay | Admitting: *Deleted

## 2015-07-23 ENCOUNTER — Telehealth: Payer: Self-pay | Admitting: Hematology and Oncology

## 2015-07-23 NOTE — Telephone Encounter (Signed)
s.w. pt and advised on 6.19 appt.Marland KitchenMarland KitchenMarland KitchenMarland Kitchenpt ok and aware

## 2015-07-25 NOTE — Progress Notes (Signed)
Location of Breast Cancer:Upper-inner quadrant of right female breast  Histology per Pathology Report:  Diagnosis  07-05-15 Breast, right, needle core biopsy, 12:30 o'clock - INVASIVE DUCTAL CARCINOMA. - SEE MICROSCOPIC DESCRIPTION Receptor Status: ER(100 % +), PR (95 % +), Her2-neu (neg), Ki-(15 %)  [name] presented with a routine screening mammogram, she requested a 3-D mammogram which revealed abnormality in the right breast.   Past/Anticipated interventions by surgeon, if any:08-02-15 surgery scheduled Right Lumpectomy Radioactive seed SLN  Past/Anticipated interventions by medical oncology, if any: Radiation referral,Oncotype DX testing,Antiestrogen therapy  Lymphedema issues, if any: None  Pain issues, if any:  None  SAFETY ISSUES:  Prior radiation? No  Pacemaker/ICD? No  Possible current pregnancy?:No  Is the patient on methotrexate?:No Menarche16, G2,P2, BC 1 year, Menopause 60, HRT No  Current Complaints / other details: Here to discuss radiation therapy BP 115/65 mmHg  Pulse 70  Temp(Src) 97.7 F (36.5 C) (Oral)  Resp 18  Ht 5' 4.5" (1.638 m)  Wt 184 lb 9.6 oz (83.734 kg)  BMI 31.21 kg/m2  SpO2 100%     Kristy Spurling, RN 07/25/2015,9:51 AM

## 2015-07-26 ENCOUNTER — Ambulatory Visit
Admission: RE | Admit: 2015-07-26 | Discharge: 2015-07-26 | Disposition: A | Discharge status: Home / Self Care | Payer: BLUE CROSS/BLUE SHIELD | Source: Ambulatory Visit | Attending: Radiation Oncology | Admitting: Radiation Oncology

## 2015-07-26 ENCOUNTER — Encounter: Payer: Self-pay | Admitting: Radiation Oncology

## 2015-07-26 VITALS — BP 115/65 | HR 70 | Temp 97.7°F | Resp 18 | Ht 64.5 in | Wt 184.6 lb

## 2015-07-26 DIAGNOSIS — Z170 Estrogen receptor positive status [ER+]: Secondary | ICD-10-CM | POA: Diagnosis not present

## 2015-07-26 DIAGNOSIS — C50211 Malignant neoplasm of upper-inner quadrant of right female breast: Secondary | ICD-10-CM | POA: Insufficient documentation

## 2015-07-26 DIAGNOSIS — Z510 Encounter for antineoplastic radiation therapy: Secondary | ICD-10-CM | POA: Diagnosis not present

## 2015-07-26 NOTE — Addendum Note (Signed)
Encounter addended by: Malena Edman, RN on: 07/26/2015  6:16 PM<BR>     Documentation filed: Charges VN

## 2015-07-26 NOTE — Progress Notes (Signed)
  Radiation Oncology         [phone]) [phone] ________________________________  Initial Outpatient Consultation - Date: 07/26/2015   Name: Kristy Singh MRN: [id number]   DOB: 1950-10-05  REFERRING PHYSICIAN: Autumn Messing III, MD  DIAGNOSIS AND STAGE: No matching staging information was found for the patient.  Breast cancer of the upper-inner quadrant of the right female breast.  HISTORY OF PRESENT ILLNESS::Kristy Singh is a 65 y.o. female who presents after an abnormal routine mammogram. She had a diagnostic mammogram and ultrasound 07/04/2015, revealing a 7 mm mass in the 12:30 o'clock position of the right breast. Ultrasound showed a small hypoechoic mass in the same position as the mass, measuring 7 x 4 x 6 mm. The right axilla showed no enlarged or abnormal lymph nodes. She had a biopsy 07/05/2015, revealing invasive ductal carcinoma, ER (100%), PR (95%), Her2-neu negative, Ki 67 (15%).   She is here today to discuss radiation treatment options. She tolerated her biopsy well. Her father in his 9s has recently been diagnosed with colon cancer.  PREVIOUS RADIATION THERAPY: No  Past medical, social and family history were reviewed in the electronic chart. Review of symptoms was reviewed in the electronic chart. Medications were reviewed in the electronic chart.   PHYSICAL EXAM:  Filed Vitals:   07/26/15 1430  BP: 115/65  Pulse: 70  Temp: 97.7 F (36.5 C)  Resp: 18  .184 lb 9.6 oz (83.734 kg). Normal appearing left breast. Biopsy site on the right breast. No swelling or hematoma.   IMPRESSION: Kristy Singh is a 65 yo woman with invasive ductal carcinoma of the right breast. She is a good candidate for external radiation.  PLAN: I spoke to the patient today regarding her diagnosis and options for treatment. We discussed the equivalence in terms of survival and local failure between mastectomy and breast conservation. We discussed the role of radiation in decreasing local failures in patients who  undergo lumpectomy. We discussed the process of simulation and the placement tattoos. We discussed 4 weeks of treatment as an outpatient. We discussed the possibility of asymptomatic lung damage. We discussed the low likelihood of secondary malignancies. We discussed the possible side effects including but not limited to skin redness, fatigue, permanent skin darkening, and breast swelling.   I will see her back after her Oncotype and surgery.    I spent 40 minutes  face to face with the patient and more than 50% of that time was spent in counseling and/or coordination of care.   ------------------------------------------------  Thea Silversmith, MD    This document serves as a record of services personally performed by Thea Silversmith, MD. It was created on her behalf by  Lendon Collar, a trained medical scribe. The creation of this record is based on the scribe's personal observations and the provider's statements to them. This document has been checked and approved by the attending provider.

## 2015-07-30 ENCOUNTER — Encounter (HOSPITAL_COMMUNITY)
Admission: RE | Admit: 2015-07-30 | Discharge: 2015-07-30 | Disposition: A | Discharge status: Home / Self Care | Payer: BLUE CROSS/BLUE SHIELD | Source: Ambulatory Visit | Attending: General Surgery | Admitting: General Surgery

## 2015-07-30 ENCOUNTER — Encounter (HOSPITAL_COMMUNITY): Payer: Self-pay

## 2015-07-30 DIAGNOSIS — Z170 Estrogen receptor positive status [ER+]: Secondary | ICD-10-CM | POA: Diagnosis not present

## 2015-07-30 DIAGNOSIS — C50211 Malignant neoplasm of upper-inner quadrant of right female breast: Secondary | ICD-10-CM | POA: Diagnosis not present

## 2015-07-30 DIAGNOSIS — Z79899 Other long term (current) drug therapy: Secondary | ICD-10-CM | POA: Diagnosis not present

## 2015-07-30 DIAGNOSIS — Z7982 Long term (current) use of aspirin: Secondary | ICD-10-CM | POA: Diagnosis not present

## 2015-07-30 DIAGNOSIS — C50911 Malignant neoplasm of unspecified site of right female breast: Secondary | ICD-10-CM | POA: Diagnosis present

## 2015-07-30 DIAGNOSIS — E7800 Pure hypercholesterolemia, unspecified: Secondary | ICD-10-CM | POA: Diagnosis not present

## 2015-07-30 HISTORY — DX: Anesthesia of skin: R20.0

## 2015-07-30 HISTORY — DX: Presence of spectacles and contact lenses: Z97.3

## 2015-07-30 HISTORY — DX: Malignant (primary) neoplasm, unspecified: C80.1

## 2015-07-30 HISTORY — DX: Paresthesia of skin: R20.2

## 2015-07-30 HISTORY — DX: Personal history of other diseases of the digestive system: Z87.19

## 2015-07-30 HISTORY — DX: Personal history of other diseases of the respiratory system: Z87.09

## 2015-07-30 LAB — CBC
HEMATOCRIT: 42.2 % (ref 36.0–46.0)
Hemoglobin: 14.1 g/dL (ref 12.0–15.0)
MCH: 30.4 pg (ref 26.0–34.0)
MCHC: 33.4 g/dL (ref 30.0–36.0)
MCV: 90.9 fL (ref 78.0–100.0)
PLATELETS: 308 10*3/uL (ref 150–400)
RBC: 4.64 MIL/uL (ref 3.87–5.11)
RDW: 12.5 % (ref 11.5–15.5)
WBC: 5.2 10*3/uL (ref 4.0–10.5)

## 2015-07-30 NOTE — Progress Notes (Signed)
PCP - Dr. Lew Dawes Cardiologist - denies  EKG/CXR - denies Echo/stress test/cardiac cath - denies  Patient denies chest pain and shortness of breath at PAT appointment.

## 2015-07-30 NOTE — Pre-Procedure Instructions (Signed)
Kristy Singh  07/30/2015      [id number] - [place], [place] - [address] [place] [address] Phone: [phone] Fax: [phone]  [facility], [place] [address] Phone: [phone] Fax: [phone]    Your procedure is scheduled on Friday, June 9th, 2017.  Report to [facility] Admitting at 9:00 A.M.   Call this number if you have problems the morning of surgery:  [phone]   Remember:  Do not eat food or drink liquids after midnight.   Take these medicines the morning of surgery with A SIP OF WATER: None.  5 days prior to surgery, stop taking: Aspirin, NSAIDS, Aleve, Naproxen, Ibuprofen, Advil, Motrin, BC's, Goody's, Fish oil, all herbal medications, and all vitamins.    Do not wear jewelry, make-up or nail polish.  Do not wear lotions, powders, or perfumes.  You may NOT wear deodorant.  Do not shave 48 hours prior to surgery.    Do not bring valuables to the hospital.   [facility] is not responsible for any belongings or valuables.  Contacts, dentures or bridgework may not be worn into surgery.  Leave your suitcase in the car.  After surgery it may be brought to your room.  For patients admitted to the hospital, discharge time will be determined by your treatment team.  Patients discharged the day of surgery will not be allowed to drive home.    Special instructions:  See attached.   Please read over the following fact sheets that you were given. Pain Booklet     [place]- Preparing For Surgery  Before surgery, you can play an important role. Because skin is not sterile, your skin needs to be as free of germs as possible. You can reduce the number of germs on your skin by washing with CHG (chlorahexidine gluconate) Soap before surgery.  CHG is an antiseptic cleaner which kills germs and bonds with the skin to continue killing  germs even after washing.  Please do not use if you have an allergy to CHG or antibacterial soaps. If your skin becomes reddened/irritated stop using the CHG.  Do not shave (including legs and underarms) for at least 48 hours prior to first CHG shower. It is OK to shave your face.  Please follow these instructions carefully.   1. Shower the NIGHT BEFORE SURGERY and the MORNING OF SURGERY with CHG.   2. If you chose to wash your hair, wash your hair first as usual with your normal shampoo.  3. After you shampoo, rinse your hair and body thoroughly to remove the shampoo.  4. Use CHG as you would any other liquid soap. You can apply CHG directly to the skin and wash gently with a scrungie or a clean washcloth.   5. Apply the CHG Soap to your body ONLY FROM THE NECK DOWN.  Do not use on open wounds or open sores. Avoid contact with your eyes, ears, mouth and genitals (private parts). Wash genitals (private parts) with your normal soap.  6. Wash thoroughly, paying special attention to the area where your surgery will be performed.  7. Thoroughly rinse your body with warm water from the neck down.  8. DO NOT shower/wash with your normal soap after using and rinsing off the CHG Soap.  9. Pat yourself dry with a CLEAN TOWEL.   10. Wear CLEAN PAJAMAS  11. Place CLEAN SHEETS on your bed the night of your first shower and DO NOT SLEEP WITH PETS.    Day of Surgery: Do not apply any deodorants/lotions. Please wear clean clothes to the hospital/surgery center.

## 2015-08-01 ENCOUNTER — Ambulatory Visit
Admission: RE | Admit: 2015-08-01 | Discharge: 2015-08-01 | Disposition: A | Discharge status: Home / Self Care | Payer: BLUE CROSS/BLUE SHIELD | Source: Ambulatory Visit | Attending: General Surgery | Admitting: General Surgery

## 2015-08-01 DIAGNOSIS — C50211 Malignant neoplasm of upper-inner quadrant of right female breast: Secondary | ICD-10-CM

## 2015-08-02 ENCOUNTER — Ambulatory Visit
Admission: RE | Admit: 2015-08-02 | Discharge: 2015-08-02 | Disposition: A | Discharge status: Home / Self Care | Payer: BLUE CROSS/BLUE SHIELD | Source: Ambulatory Visit | Attending: General Surgery | Admitting: General Surgery

## 2015-08-02 ENCOUNTER — Ambulatory Visit (HOSPITAL_COMMUNITY): Payer: BLUE CROSS/BLUE SHIELD | Admitting: Anesthesiology

## 2015-08-02 ENCOUNTER — Ambulatory Visit (HOSPITAL_COMMUNITY)
Admission: RE | Admit: 2015-08-02 | Discharge: 2015-08-02 | Disposition: A | Discharge status: Home / Self Care | Payer: BLUE CROSS/BLUE SHIELD | Source: Ambulatory Visit | Attending: General Surgery | Admitting: General Surgery

## 2015-08-02 ENCOUNTER — Encounter (HOSPITAL_COMMUNITY): Payer: Self-pay | Admitting: *Deleted

## 2015-08-02 ENCOUNTER — Encounter (HOSPITAL_COMMUNITY)
Admission: RE | Disposition: A | Discharge status: Home / Self Care | Payer: Self-pay | Source: Ambulatory Visit | Attending: General Surgery

## 2015-08-02 DIAGNOSIS — Z170 Estrogen receptor positive status [ER+]: Secondary | ICD-10-CM | POA: Insufficient documentation

## 2015-08-02 DIAGNOSIS — C50211 Malignant neoplasm of upper-inner quadrant of right female breast: Secondary | ICD-10-CM | POA: Diagnosis not present

## 2015-08-02 DIAGNOSIS — E7800 Pure hypercholesterolemia, unspecified: Secondary | ICD-10-CM | POA: Insufficient documentation

## 2015-08-02 DIAGNOSIS — Z7982 Long term (current) use of aspirin: Secondary | ICD-10-CM | POA: Insufficient documentation

## 2015-08-02 DIAGNOSIS — Z79899 Other long term (current) drug therapy: Secondary | ICD-10-CM | POA: Insufficient documentation

## 2015-08-02 HISTORY — PX: BREAST LUMPECTOMY WITH RADIOACTIVE SEED AND SENTINEL LYMPH NODE BIOPSY: SHX6550

## 2015-08-02 SURGERY — BREAST LUMPECTOMY WITH RADIOACTIVE SEED AND SENTINEL LYMPH NODE BIOPSY
Anesthesia: Regional | Site: Breast | Laterality: Right

## 2015-08-02 MED ORDER — PROPOFOL 10 MG/ML IV BOLUS
INTRAVENOUS | Status: DC | PRN
  Administered 2015-08-02: 200 mg via INTRAVENOUS
  Administered 2015-08-02: 80 mg via INTRAVENOUS

## 2015-08-02 MED ORDER — FENTANYL CITRATE (PF) 100 MCG/2ML IJ SOLN
INTRAMUSCULAR | Status: AC
  Administered 2015-08-02: 100 ug via INTRAVENOUS
  Administered 2015-08-02: 25 ug via INTRAVENOUS
  Filled 2015-08-02: qty 2

## 2015-08-02 MED ORDER — PROPOFOL 10 MG/ML IV BOLUS
INTRAVENOUS | Status: AC
  Filled 2015-08-02: qty 20

## 2015-08-02 MED ORDER — CHLORHEXIDINE GLUCONATE 4 % EX LIQD: 1.0000 "application " | Freq: Once | CUTANEOUS | Status: DC

## 2015-08-02 MED ORDER — TECHNETIUM TC 99M SULFUR COLLOID FILTERED
1.0000 | Freq: Once | INTRAVENOUS | Status: AC | PRN
  Administered 2015-08-02: 1 via INTRADERMAL

## 2015-08-02 MED ORDER — DEXAMETHASONE SODIUM PHOSPHATE 10 MG/ML IJ SOLN
INTRAMUSCULAR | Status: DC | PRN
  Administered 2015-08-02: 10 mg via INTRAVENOUS

## 2015-08-02 MED ORDER — LACTATED RINGERS IV SOLN: INTRAVENOUS | Status: DC

## 2015-08-02 MED ORDER — FENTANYL CITRATE (PF) 250 MCG/5ML IJ SOLN
INTRAMUSCULAR | Status: AC
  Filled 2015-08-02: qty 5

## 2015-08-02 MED ORDER — LACTATED RINGERS IV SOLN
INTRAVENOUS | Status: DC
  Administered 2015-08-02 (×2): via INTRAVENOUS

## 2015-08-02 MED ORDER — FENTANYL CITRATE (PF) 100 MCG/2ML IJ SOLN
25.0000 ug | INTRAMUSCULAR | Status: DC | PRN
  Administered 2015-08-02 (×2): 50 ug via INTRAVENOUS

## 2015-08-02 MED ORDER — MIDAZOLAM HCL 5 MG/5ML IJ SOLN
INTRAMUSCULAR | Status: DC | PRN
  Administered 2015-08-02: 2 mg via INTRAVENOUS

## 2015-08-02 MED ORDER — 0.9 % SODIUM CHLORIDE (POUR BTL) OPTIME
TOPICAL | Status: DC | PRN
  Administered 2015-08-02: 1000 mL

## 2015-08-02 MED ORDER — CEFAZOLIN SODIUM-DEXTROSE 2-4 GM/100ML-% IV SOLN
2.0000 g | INTRAVENOUS | Status: AC
  Administered 2015-08-02: 2 g via INTRAVENOUS
  Filled 2015-08-02: qty 100

## 2015-08-02 MED ORDER — BUPIVACAINE-EPINEPHRINE 0.25% -1:200000 IJ SOLN
INTRAMUSCULAR | Status: DC | PRN
  Administered 2015-08-02: 18 mL

## 2015-08-02 MED ORDER — LIDOCAINE HCL (CARDIAC) 20 MG/ML IV SOLN
INTRAVENOUS | Status: DC | PRN
  Administered 2015-08-02: 60 mg via INTRAVENOUS

## 2015-08-02 MED ORDER — PHENYLEPHRINE HCL 10 MG/ML IJ SOLN
INTRAMUSCULAR | Status: DC | PRN
  Administered 2015-08-02 (×2): 80 ug via INTRAVENOUS

## 2015-08-02 MED ORDER — ROCURONIUM BROMIDE 50 MG/5ML IV SOLN
INTRAVENOUS | Status: AC
  Filled 2015-08-02: qty 1

## 2015-08-02 MED ORDER — OXYCODONE-ACETAMINOPHEN 5-325 MG PO TABS: 1.0000 | ORAL_TABLET | ORAL | Status: DC | PRN

## 2015-08-02 MED ORDER — EPHEDRINE SULFATE 50 MG/ML IJ SOLN
INTRAMUSCULAR | Status: DC | PRN
  Administered 2015-08-02: 10 mg via INTRAVENOUS
  Administered 2015-08-02: 5 mg via INTRAVENOUS
  Administered 2015-08-02: 10 mg via INTRAVENOUS

## 2015-08-02 MED ORDER — ONDANSETRON HCL 4 MG/2ML IJ SOLN: 4.0000 mg | Freq: Once | INTRAMUSCULAR | Status: DC | PRN

## 2015-08-02 MED ORDER — FENTANYL CITRATE (PF) 100 MCG/2ML IJ SOLN
50.0000 ug | Freq: Once | INTRAMUSCULAR | Status: AC
  Administered 2015-08-02: 50 ug via INTRAVENOUS
  Filled 2015-08-02: qty 1

## 2015-08-02 MED ORDER — BUPIVACAINE-EPINEPHRINE (PF) 0.5% -1:200000 IJ SOLN
INTRAMUSCULAR | Status: DC | PRN
  Administered 2015-08-02: 25 mL via PERINEURAL

## 2015-08-02 MED ORDER — FENTANYL CITRATE (PF) 100 MCG/2ML IJ SOLN: 25.0000 ug | INTRAMUSCULAR | Status: DC | PRN

## 2015-08-02 MED ORDER — ONDANSETRON HCL 4 MG/2ML IJ SOLN
INTRAMUSCULAR | Status: AC
  Filled 2015-08-02: qty 2

## 2015-08-02 MED ORDER — METHYLENE BLUE 0.5 % INJ SOLN
INTRAVENOUS | Status: AC
  Filled 2015-08-02: qty 10

## 2015-08-02 MED ORDER — ONDANSETRON HCL 4 MG/2ML IJ SOLN
INTRAMUSCULAR | Status: DC | PRN
  Administered 2015-08-02: 4 mg via INTRAVENOUS

## 2015-08-02 MED ORDER — MIDAZOLAM HCL 2 MG/2ML IJ SOLN
INTRAMUSCULAR | Status: AC
  Filled 2015-08-02: qty 2

## 2015-08-02 MED ORDER — SODIUM CHLORIDE 0.9 % IJ SOLN
INTRAMUSCULAR | Status: AC
  Filled 2015-08-02: qty 10

## 2015-08-02 MED ORDER — FENTANYL CITRATE (PF) 100 MCG/2ML IJ SOLN
INTRAMUSCULAR | Status: AC
  Filled 2015-08-02: qty 2

## 2015-08-02 MED ORDER — HYDROCODONE-ACETAMINOPHEN 7.5-325 MG PO TABS: 1.0000 | ORAL_TABLET | Freq: Once | ORAL | Status: DC | PRN

## 2015-08-02 SURGICAL SUPPLY — 40 items
APPLIER CLIP 9.375 MED OPEN (MISCELLANEOUS) ×3
APR CLP MED 9.3 20 MLT OPN (MISCELLANEOUS) ×1
BINDER BREAST LRG (GAUZE/BANDAGES/DRESSINGS) IMPLANT
BINDER BREAST XLRG (GAUZE/BANDAGES/DRESSINGS) IMPLANT
BLADE SURG 15 STRL LF DISP TIS (BLADE) ×1 IMPLANT
BLADE SURG 15 STRL SS (BLADE) ×3
CANISTER SUCTION 2500CC (MISCELLANEOUS) ×3 IMPLANT
CHLORAPREP W/TINT 26ML (MISCELLANEOUS) ×3 IMPLANT
CLIP APPLIE 9.375 MED OPEN (MISCELLANEOUS) ×1 IMPLANT
CONT SPEC 4OZ CLIKSEAL STRL BL (MISCELLANEOUS) ×22 IMPLANT
COVER PROBE W GEL 5X96 (DRAPES) ×3 IMPLANT
COVER SURGICAL LIGHT HANDLE (MISCELLANEOUS) ×3 IMPLANT
DECANTER SPIKE VIAL GLASS SM (MISCELLANEOUS) ×2 IMPLANT
DRAPE CHEST BREAST 15X10 FENES (DRAPES) ×3 IMPLANT
DRAPE UTILITY XL STRL (DRAPES) ×6 IMPLANT
ELECT CAUTERY BLADE 6.4 (BLADE) ×3 IMPLANT
ELECT REM PT RETURN 9FT ADLT (ELECTROSURGICAL) ×3
ELECTRODE REM PT RTRN 9FT ADLT (ELECTROSURGICAL) ×1 IMPLANT
GLOVE BIO SURGEON STRL SZ7.5 (GLOVE) ×3 IMPLANT
GOWN STRL REUS W/ TWL LRG LVL3 (GOWN DISPOSABLE) ×2 IMPLANT
GOWN STRL REUS W/TWL LRG LVL3 (GOWN DISPOSABLE) ×6
ILLUMINATOR WAVEGUIDE N/F (MISCELLANEOUS) ×2 IMPLANT
KIT BASIN OR (CUSTOM PROCEDURE TRAY) ×3 IMPLANT
KIT MARKER MARGIN INK (KITS) ×3 IMPLANT
LIQUID BAND (GAUZE/BANDAGES/DRESSINGS) ×3 IMPLANT
NDL HYPO 25X1 1.5 SAFETY (NEEDLE) ×1 IMPLANT
NEEDLE HYPO 25X1 1.5 SAFETY (NEEDLE) ×3 IMPLANT
NS IRRIG 1000ML POUR BTL (IV SOLUTION) ×3 IMPLANT
PACK SURGICAL SETUP 50X90 (CUSTOM PROCEDURE TRAY) ×3 IMPLANT
PENCIL BUTTON HOLSTER BLD 10FT (ELECTRODE) ×3 IMPLANT
SPONGE LAP 18X18 X RAY DECT (DISPOSABLE) ×3 IMPLANT
SUT MNCRL AB 4-0 PS2 18 (SUTURE) ×5 IMPLANT
SUT VIC AB 3-0 SH 18 (SUTURE) ×3 IMPLANT
SYR BULB 3OZ (MISCELLANEOUS) ×3 IMPLANT
SYR CONTROL 10ML LL (SYRINGE) ×3 IMPLANT
TOWEL OR 17X24 6PK STRL BLUE (TOWEL DISPOSABLE) ×3 IMPLANT
TOWEL OR 17X26 10 PK STRL BLUE (TOWEL DISPOSABLE) ×3 IMPLANT
TUBE CONNECTING 12'X1/4 (SUCTIONS) ×1
TUBE CONNECTING 12X1/4 (SUCTIONS) ×2 IMPLANT
YANKAUER SUCT BULB TIP NO VENT (SUCTIONS) ×3 IMPLANT

## 2015-08-02 NOTE — Anesthesia Procedure Notes (Signed)
Anesthesia Regional Block:  Pectoralis block  Pre-Anesthetic Checklist: ,, timeout performed, Correct Patient, Correct Site, Correct Laterality, Correct Procedure, Correct Position, site marked, Risks and benefits discussed,  Surgical consent,  Pre-op evaluation,  At surgeon's request and post-op pain management  Laterality: Right  Prep: chloraprep       Needles:  Injection technique: Single-shot  Needle Type: Stimiplex     Needle Length: 9cm 9 cm Needle Gauge: 21 and 21 G    Additional Needles:  Procedures: ultrasound guided (picture in chart) Pectoralis block Narrative:  Injection made incrementally with aspirations every 5 mL.  Performed by: Personally  Anesthesiologist: Derinda Bartus  Additional Notes: Risks, benefits and alternative to block explained extensively.  Patient tolerated procedure well, without complications.

## 2015-08-02 NOTE — Transfer of Care (Signed)
Immediate Anesthesia Transfer of Care Note  Patient: Kristy Singh  Procedure(s) Performed: Procedure(s): BREAST LUMPECTOMY WITH RADIOACTIVE SEED AND SENTINEL LYMPH NODE BIOPSY (Right)  Patient Location: PACU  Anesthesia Type:General  Level of Consciousness: awake, alert , oriented and patient cooperative  Airway & Oxygen Therapy: Patient Spontanous Breathing and Patient connected to nasal cannula oxygen  Post-op Assessment: Report given to RN and Post -op Vital signs reviewed and stable  Post vital signs: Reviewed and stable  Last Vitals:  Filed Vitals:   08/02/15 1040 08/02/15 1304  BP: 163/67 126/59  Pulse: 77 91  Temp:  36.4 C  Resp: 13 10    Last Pain: There were no vitals filed for this visit.    Patients Stated Pain Goal: 2 ([id number])  Complications: No apparent anesthesia complications

## 2015-08-02 NOTE — Anesthesia Postprocedure Evaluation (Signed)
Anesthesia Post Note  Patient: Kristy Singh  Procedure(s) Performed: Procedure(s) (LRB): BREAST LUMPECTOMY WITH RADIOACTIVE SEED AND SENTINEL LYMPH NODE BIOPSY (Right)  Patient location during evaluation: PACU Anesthesia Type: General and Regional Level of consciousness: awake and alert Pain management: pain level controlled Vital Signs Assessment: post-procedure vital signs reviewed and stable Respiratory status: spontaneous breathing, nonlabored ventilation, respiratory function stable and patient connected to nasal cannula oxygen Cardiovascular status: blood pressure returned to baseline and stable Postop Assessment: no signs of nausea or vomiting Anesthetic complications: no    Last Vitals:  Filed Vitals:   08/02/15 1320 08/02/15 1335  BP: 128/81 122/70  Pulse: 79 81  Temp:    Resp: 14 17    Last Pain:  Filed Vitals:   08/02/15 1336  PainSc: 6                  Zenaida Deed

## 2015-08-02 NOTE — H&P (Signed)
Kristy Singh  Location: [facility] Patient #: [id number] DOB: [birth year] Married / Language: English / Race: White Female   History of Present Illness  The patient is a 65 year old female who presents with breast cancer. We are asked to see the patient in consultation by Dr. Lajean Manes to evaluate her for a new right breast cancer. The patient is a 65 year old white female who recently went for a routine screening mammogram. At that time she was found to have a small abnormality in the upper portion of the right breast. This measured 7 mm by ultrasound. It was biopsied and came back as a grade 2 invasive ductal cancer. It was ER and PR positive and HER-2 negative with a Ki-67 of 15%. She denies any breast pain or discharge from the nipple. She does not take any hormone replacement. She does have a family history of breast cancer and her mother   Other Problems Hemorrhoids Hypercholesterolemia Kidney Stone  Past Surgical History Breast Biopsy Right. Colon Polyp Removal - Colonoscopy Gallbladder Surgery - Laparoscopic Hysterectomy (not due to cancer) - Partial  Diagnostic Studies History Colonoscopy 5-10 years ago Mammogram within last year Pap Smear 1-5 years ago  Allergies No Known Drug Allergies  Medication History  Rosuvastatin Calcium (10MG Tablet, Oral) Active. Vitamin D (1000UNIT Tablet, Oral) Active. Aspirin (81MG Tablet, Oral) Active. Medications Reconciled  Social History  Caffeine use Coffee. No alcohol use No drug use Tobacco use Never smoker.  Family History  Breast Cancer Mother. Colon Cancer Father. Colon Polyps Father. Hypertension Father, Mother.  Pregnancy / Birth History Age at menarche 20 years. Age of menopause 77-55 Gravida 2 Maternal age 46-25 Para 2    Review of Systems  General Not Present- Appetite Loss, Chills, Fatigue, Fever, Night Sweats, Weight Gain and Weight Loss. Skin Not  Present- Change in Wart/Mole, Dryness, Hives, Jaundice, New Lesions, Non-Healing Wounds, Rash and Ulcer. HEENT Present- Wears glasses/contact lenses. Not Present- Earache, Hearing Loss, Hoarseness, Nose Bleed, Oral Ulcers, Ringing in the Ears, Seasonal Allergies, Sinus Pain, Sore Throat, Visual Disturbances and Yellow Eyes. Respiratory Not Present- Bloody sputum, Chronic Cough, Difficulty Breathing, Snoring and Wheezing. Breast Not Present- Breast Mass, Breast Pain, Nipple Discharge and Skin Changes. Cardiovascular Not Present- Chest Pain, Difficulty Breathing Lying Down, Leg Cramps, Palpitations, Rapid Heart Rate, Shortness of Breath and Swelling of Extremities. Gastrointestinal Present- Hemorrhoids. Not Present- Abdominal Pain, Bloating, Bloody Stool, Change in Bowel Habits, Chronic diarrhea, Constipation, Difficulty Swallowing, Excessive gas, Gets full quickly at meals, Indigestion, Nausea, Rectal Pain and Vomiting. Female Genitourinary Not Present- Frequency, Nocturia, Painful Urination, Pelvic Pain and Urgency. Musculoskeletal Not Present- Back Pain, Joint Pain, Joint Stiffness, Muscle Pain, Muscle Weakness and Swelling of Extremities. Neurological Not Present- Decreased Memory, Fainting, Headaches, Numbness, Seizures, Tingling, Tremor, Trouble walking and Weakness. Psychiatric Not Present- Anxiety, Bipolar, Change in Sleep Pattern, Depression, Fearful and Frequent crying. Endocrine Present- Hot flashes. Not Present- Cold Intolerance, Excessive Hunger, Hair Changes, Heat Intolerance and New Diabetes. Hematology Not Present- Easy Bruising, Excessive bleeding, Gland problems, HIV and Persistent Infections.  Vitals  Weight: 182 lb Height: 65in Body Surface Area: 1.9 m Body Mass Index: 30.29 kg/m  Temp.: 97.30F  Pulse: 69 (Regular)  BP: 128/82 (Sitting, Left Arm, Standard)       Physical Exam General Mental Status-Alert. General Appearance-Consistent with stated  age. Hydration-Well hydrated. Voice-Normal.  Head and Neck Head-normocephalic, atraumatic with no lesions or palpable masses. Trachea-midline. Thyroid Gland Characteristics - normal size and consistency.  Eye  Eyeball - Bilateral-Extraocular movements intact. Sclera/Conjunctiva - Bilateral-No scleral icterus.  Chest and Lung Exam Chest and lung exam reveals -quiet, even and easy respiratory effort with no use of accessory muscles and on auscultation, normal breath sounds, no adventitious sounds and normal vocal resonance. Inspection Chest Wall - Normal. Back - normal.  Breast Note: There is no palpable mass in either breast. There is no palpable axillary, supraclavicular, or cervical lymphadenopathy.   Cardiovascular Cardiovascular examination reveals -normal heart sounds, regular rate and rhythm with no murmurs and normal pedal pulses bilaterally.  Abdomen Inspection Inspection of the abdomen reveals - No Hernias. Skin - Scar - no surgical scars. Palpation/Percussion Palpation and Percussion of the abdomen reveal - Soft, Non Tender, No Rebound tenderness, No Rigidity (guarding) and No hepatosplenomegaly. Auscultation Auscultation of the abdomen reveals - Bowel sounds normal.  Neurologic Neurologic evaluation reveals -alert and oriented x 3 with no impairment of recent or remote memory. Mental Status-Normal.  Musculoskeletal Normal Exam - Left-Upper Extremity Strength Normal and Lower Extremity Strength Normal. Normal Exam - Right-Upper Extremity Strength Normal and Lower Extremity Strength Normal.  Lymphatic Head & Neck  General Head & Neck Lymphatics: Bilateral - Description - Normal. Axillary  General Axillary Region: Bilateral - Description - Normal. Tenderness - Non Tender. Femoral & Inguinal  Generalized Femoral & Inguinal Lymphatics: Bilateral - Description - Normal. Tenderness - Non Tender.    Assessment & Plan BREAST CANCER OF  UPPER-INNER QUADRANT OF RIGHT FEMALE BREAST (C50.211) Impression: The patient appears to have a small stage I cancer in the upper portion of the right breast. I have talked her in detail about the different options for treatment including breast conservation versus mastectomy. At this point she would like to take some time to make a decision that she will be happy with. I will go ahead and refer her to medical and radiation oncology and will wait to hear back from her. I have discussed with her in detail the risks and benefits of the operation to remove the cancer and check the lymph nodes as well as some of the technical aspects and she understands and wishes to proceed Current Plans Referred to Oncology, for evaluation and follow up (Oncology). Routine. Pt Education - Breast Cancer: discussed with patient and provided information.   Signed by Luella Cook, MD

## 2015-08-02 NOTE — Op Note (Signed)
08/02/2015  12:55 PM  PATIENT:  Kristy Singh  65 y.o. female  PRE-OPERATIVE DIAGNOSIS:  RIGHT BREAST CANCER  POST-OPERATIVE DIAGNOSIS:  RIGHT BREAST CANCER  PROCEDURE:  Procedure(s): BREAST LUMPECTOMY WITH RADIOACTIVE SEED AND SENTINEL LYMPH NODE BIOPSY (Right)  SURGEON:  Surgeon(s) and Role:    * Jovita Kussmaul, MD - Primary  PHYSICIAN ASSISTANT:   ASSISTANTS: none   ANESTHESIA:   general  EBL:  Total I/O In: 1000 [I.V.:1000] Out: -   BLOOD ADMINISTERED:none  DRAINS: none   LOCAL MEDICATIONS USED:  MARCAINE     SPECIMEN:  Source of Specimen:  right breast tissue and sentinel nodes X 3 with margins  DISPOSITION OF SPECIMEN:  PATHOLOGY  COUNTS:  YES  TOURNIQUET:  * No tourniquets in log *  DICTATION: .Dragon Dictation    after informed consent was obtained the patient was brought to the operating room and placed in the supine position on the operating room table.After adequate induction of general anesthesia the patient's right chest, breast, and axillary area were prepped with ChloraPrep, allowed to dry, and draped in the usual sterile manner. An appropriate timeout was performed. Previously an I-125 seed was placed in the upper portion of the right breast to mark an area of invasive breast cancer. Earlier in the day the patient also underwent injection of 1 mCi of technetium sulfur colloid in the subareolar position on the right. At this point the neoprobe was set to technetium. The right axilla was examined and a hot spot was identified. A small transversely oriented incision was made overlying the hot spot with a 15 blade knife.The incision was carried through the skin and subcutaneous tissue sharply with electrocautery until the axilla was entered. A Wheaton retractor was deployed. Using the neoprobe to direct blunt hemostat dissection I was able to identify 3 hot lymph nodes. These lymph nodes were excised sharply with the electrocautery and the lymphatics were controlled  with clips.Ex vivo counts on the sentinel nodes ranged from 200-500. These were sent as sentinel nodes numbers 1 through 3. No other hot or palpable lymph nodes were identified in the right axilla. The area was irrigated with saline and infiltrated with quarter percent Marcaine. The deep layer of the wound was closed with interrupted 3-0 Vicryl stitches. The skin was then closed with a running 4-0 Monocryl subcuticular stitch. Attention was then turned to the right breast. The neoprobe was set to I-125 in the area of the radioactive seed was readily identified in the 12:00 position. A curvilinear incision was made along the upper edge of the areola with a 15 blade knife. The incision was carried through the skin and subcutaneous tissue sharply with the electrocautery. The dissection was then carried towards the radioactive seed sharply. While checking the area of radioactive seed frequently a circular portion of breast tissue was excised sharply around the radioactive seed. In doing so we did come across the seed. The seed was placed in a separate cup, x-rayed, and sent to pathology. Additional margins were then taken all around the main resection specimen and each specimen was marked with the appropriate paint color. The main specimen that was removed was also marked with the appropriate paint colors. A specimen radiograph was obtained but I was not sure that I could see the clip in the specimen. The clip was then clearly identified and the first superior margin. An additional superior margin was taken beyond this area and marked appropriately. At this point the cancer appeared to be  resected. The  Lumpectomy cavity was then marked with clips. The wound was examined and hemostasis was achieved using the Bovie electrocautery. The wound was irrigated with saline and infiltrated with quarter percent Marcaine. The deep layer of the wound was then closed with layers of interrupted 3-0 Vicryl stitches. The skin was then  closed with interrupted 4-0 Monocryl subcuticular stitches. Dermabond dressings were applied. At the end of the case all needle sponge and instrument counts were correct. The patient tolerated the surgery well. The patient was then awakened and taken to recovery in stable condition.  PLAN OF CARE: Discharge to home after PACU  PATIENT DISPOSITION:  PACU - hemodynamically stable.   Delay start of Pharmacological VTE agent (>24hrs) due to surgical blood loss or risk of bleeding: not applicable

## 2015-08-02 NOTE — Anesthesia Preprocedure Evaluation (Signed)
Anesthesia Evaluation  Patient identified by MRN, date of birth, ID band Patient awake    Reviewed: Allergy & Precautions, H&P , NPO status , Patient's Chart, lab work & pertinent test results  History of Anesthesia Complications Negative for: history of anesthetic complications  Airway Mallampati: II  TM Distance: >3 FB Neck ROM: full    Dental no notable dental hx.    Pulmonary neg pulmonary ROS,    Pulmonary exam normal breath sounds clear to auscultation       Cardiovascular negative cardio ROS Normal cardiovascular exam Rhythm:regular Rate:Normal     Neuro/Psych negative neurological ROS     GI/Hepatic Neg liver ROS, GERD  ,  Endo/Other  negative endocrine ROS  Renal/GU negative Renal ROS     Musculoskeletal   Abdominal   Peds  Hematology negative hematology ROS (+)   Anesthesia Other Findings   Reproductive/Obstetrics negative OB ROS                             Anesthesia Physical Anesthesia Plan  ASA: II  Anesthesia Plan: General and Regional   Post-op Pain Management: GA combined w/ Regional for post-op pain   Induction: Intravenous  Airway Management Planned: LMA  Additional Equipment:   Intra-op Plan:   Post-operative Plan:   Informed Consent: I have reviewed the patients History and Physical, chart, labs and discussed the procedure including the risks, benefits and alternatives for the proposed anesthesia with the patient or authorized representative who has indicated his/her understanding and acceptance.   Dental Advisory Given  Plan Discussed with: Anesthesiologist, CRNA and Surgeon  Anesthesia Plan Comments: (GA + PECS block)        Anesthesia Quick Evaluation

## 2015-08-02 NOTE — Interval H&P Note (Signed)
History and Physical Interval Note:  08/02/2015 10:26 AM  Kristy Singh  has presented today for surgery, with the diagnosis of RIGHT BREAST CANCER  The various methods of treatment have been discussed with the patient and family. After consideration of risks, benefits and other options for treatment, the patient has consented to  Procedure(s): BREAST LUMPECTOMY WITH RADIOACTIVE SEED AND SENTINEL LYMPH NODE BIOPSY (Right) as a surgical intervention .  The patient's history has been reviewed, patient examined, no change in status, stable for surgery.  I have reviewed the patient's chart and labs.  Questions were answered to the patient's satisfaction.     TOTH III,Kierre Hintz S

## 2015-08-05 ENCOUNTER — Encounter (HOSPITAL_COMMUNITY): Payer: Self-pay | Admitting: General Surgery

## 2015-08-07 ENCOUNTER — Ambulatory Visit: Payer: BLUE CROSS/BLUE SHIELD | Admitting: Radiation Oncology

## 2015-08-07 ENCOUNTER — Ambulatory Visit: Payer: BLUE CROSS/BLUE SHIELD

## 2015-08-12 ENCOUNTER — Telehealth: Payer: Self-pay | Admitting: Hematology and Oncology

## 2015-08-12 ENCOUNTER — Encounter: Payer: Self-pay | Admitting: Hematology and Oncology

## 2015-08-12 ENCOUNTER — Telehealth: Payer: Self-pay | Admitting: *Deleted

## 2015-08-12 ENCOUNTER — Ambulatory Visit (HOSPITAL_BASED_OUTPATIENT_CLINIC_OR_DEPARTMENT_OTHER): Payer: BLUE CROSS/BLUE SHIELD | Admitting: Hematology and Oncology

## 2015-08-12 VITALS — BP 128/67 | HR 85 | Temp 97.9°F | Resp 17 | Wt 184.8 lb

## 2015-08-12 DIAGNOSIS — C50211 Malignant neoplasm of upper-inner quadrant of right female breast: Secondary | ICD-10-CM

## 2015-08-12 NOTE — Telephone Encounter (Signed)
appt made and avs printed °

## 2015-08-12 NOTE — Assessment & Plan Note (Signed)
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 I discussed the margins as well as lymph node surgeries. We also discussed the final staging along with previously performed ER/PR and HER-2/neu testing.  Recommendation: 1. Oncotype DX testing to determine if chemotherapy would be of any benefit followed by 2. Adjuvant radiation therapy followed by 3. Adjuvant antiestrogen therapy  Return to clinic based upon Oncotype DX test results

## 2015-08-12 NOTE — Telephone Encounter (Signed)
Ordered Oncotype Dx test per MD.  Faxed request to [facility] & called Peter Congo to verify she received it.  Faxed order to [facility].  Faxed order to [facility].  Placed a note to look for results.

## 2015-08-12 NOTE — Progress Notes (Signed)
Patient Care Team: Cassandria Anger, MD as PCP - General [name], MD as Consulting Physician (Obstetrics and Gynecology)  DIAGNOSIS: Breast cancer of upper-inner quadrant of right female breast [facility])   Staging form: Breast, AJCC 7th Edition     Clinical: No stage assigned - Unsigned     Pathologic: Stage IA (T1b, N0, cM0) - Unsigned  SUMMARY OF ONCOLOGIC HISTORY:   Breast cancer of upper-inner quadrant of right female breast ([place])   07/04/2015 Mammogram Right breast mass 7 mm 12:30 position   07/05/2015 Initial Diagnosis Right breast biopsy 12:30 position: Invasive ductal carcinoma grade 2, ER 100%, PR 95%, HER-2 negative ratio 1.29, K 67 15%, T1 BN 0 stage IA   08/02/2015 Surgery Right lumpectomy: Invasive ductal carcinoma grade 2, 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 She is recovering fairly well from the surgery. She is slightly sore underneath the arm.  REVIEW OF SYSTEMS:   Constitutional: Denies fevers, chills or abnormal weight loss Eyes: Denies blurriness of vision Ears, nose, mouth, throat, and face: Denies mucositis or sore throat Respiratory: Denies cough, dyspnea or wheezes Cardiovascular: Denies palpitation, chest discomfort Gastrointestinal:  Denies nausea, heartburn or change in bowel habits Skin: Denies abnormal skin rashes Lymphatics: Denies new lymphadenopathy or easy bruising Neurological:Denies numbness, tingling or new weaknesses Behavioral/Psych: Mood is stable, no new changes  Extremities: No lower extremity edema Breast: Recent right lumpectomy All other systems were reviewed with the patient and are negative.  I have reviewed the past medical history, past  surgical history, social history and family history with the patient and they are unchanged from previous note.  ALLERGIES:  has No Known Allergies.  MEDICATIONS:  Current Outpatient Prescriptions  Medication Sig Dispense Refill  . aspirin EC 81 MG tablet Take 81 mg by mouth daily.    . Cholecalciferol (VITAMIN D3) 1000 UNITS tablet Take 1,000 Units by mouth daily.      Marland Kitchen oxyCODONE-acetaminophen (ROXICET) 5-325 MG tablet Take 1-2 tablets by mouth every 4 (four) hours as needed. 30 tablet 0  . rosuvastatin (CRESTOR) 10 MG tablet TAKE 1 TABLET DAILY 90 tablet 1   No current facility-administered medications for this visit.    PHYSICAL EXAMINATION: ECOG PERFORMANCE STATUS: 1 - Symptomatic but completely ambulatory  Filed Vitals:   08/12/15 1046  BP: 128/67  Pulse: 85  Temp: 97.9 F (36.6 C)  Resp: 17   Filed Weights   08/12/15 1046  Weight: 184 lb 12.8 oz (83.825 kg)    GENERAL:alert, no distress and comfortable SKIN: skin color, texture, turgor are normal, no rashes or significant lesions EYES: normal, Conjunctiva are pink and non-injected, sclera clear OROPHARYNX:no exudate, no erythema and lips, buccal mucosa, and tongue normal  NECK: supple, thyroid normal size, non-tender, without nodularity LYMPH:  no palpable lymphadenopathy in the cervical, axillary or inguinal LUNGS: clear to auscultation and percussion with normal breathing effort HEART: regular rate & rhythm and no murmurs and no lower extremity edema ABDOMEN:abdomen soft, non-tender and normal bowel sounds MUSCULOSKELETAL:no cyanosis of digits and no clubbing  NEURO: alert & oriented x 3 with fluent speech, no focal motor/sensory deficits EXTREMITIES: No lower extremity edema  LABORATORY DATA:  I have reviewed the data  as listed   Chemistry      Component Value Date/Time   NA 143 11/28/2013 0919   K 4.8 11/28/2013 0919   CL 108 11/28/2013 0919   CO2 25 11/28/2013 0919   BUN 15 11/28/2013 0919    CREATININE 0.8 11/28/2013 0919      Component Value Date/Time   CALCIUM 9.8 11/28/2013 0919   ALKPHOS 89 11/28/2013 0919   AST 25 11/28/2013 0919   ALT 29 11/28/2013 0919   BILITOT 0.7 11/28/2013 0919       Lab Results  Component Value Date   WBC 5.2 07/30/2015   HGB 14.1 07/30/2015   HCT 42.2 07/30/2015   MCV 90.9 07/30/2015   PLT 308 07/30/2015   NEUTROABS 2.0 11/28/2013     ASSESSMENT & PLAN:  Breast cancer of upper-inner quadrant of right female breast (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 I discussed the margins as well as lymph node surgeries. We also discussed the final staging along with previously performed ER/PR and HER-2/neu testing.  Recommendation: 1. Oncotype DX testing to determine if chemotherapy would be of any benefit followed by 2. Adjuvant radiation therapy followed by 3. Adjuvant antiestrogen therapy  Return to clinic based upon Oncotype DX test results   No orders of the defined types were placed in this encounter.   The patient has a good understanding of the overall plan. she agrees with it. she will call with any problems that may develop before the next visit here.   Rulon Eisenmenger, MD 08/12/2015

## 2015-08-20 ENCOUNTER — Telehealth: Payer: Self-pay | Admitting: *Deleted

## 2015-08-20 ENCOUNTER — Encounter (HOSPITAL_COMMUNITY): Payer: Self-pay

## 2015-08-20 NOTE — Telephone Encounter (Signed)
Received Oncotype Dx score of 21/13%.  Placed a copy in Dr. Geralyn Flash box, gave Varney Biles and took a copy to HIM to scan.

## 2015-08-21 ENCOUNTER — Telehealth: Payer: Self-pay | Admitting: *Deleted

## 2015-08-21 NOTE — Telephone Encounter (Signed)
Spoke with patient to relay oncotype results of 21.  Per Dr. Lindi Adie no chemo needed.  Referral placed for XRT md.

## 2015-08-30 NOTE — Progress Notes (Signed)
Location of Breast Cancer:Right Breast, Upper Inner Quadrant, Invasive Ductal Carcinoma  Histology per Pathology Report: 08/02/15 Diagnosis 1. Lymph node, sentinel, biopsy, Right axillary #1 - ONE BENIGN LYMPH NODE WITH NO TUMOR SEEN (0/1). 2. Lymph node, sentinel, biopsy, Right axillary #2 - ONE BENIGN LYMPH NODE WITH NO TUMOR SEEN (0/1). 3. Lymph node, sentinel, biopsy, Right axillary #3 - ONE BENIGN LYMPH NODE WITH NO TUMOR SEEN (0/1). 4. Breast, excision, Right - FIBROCYSTIC CHANGES. - BIOPSY SITE CHANGES WITH FAT NECROSIS. - NO ATYPIA OR TUMOR SEEN. 5. Breast, excision, Right Superior Margin #1 1 of 5 FINAL for [name], [name] ([id number]) Diagnosis(continued) - INVASIVE GRADE II DUCTAL CARCINOMA, SPANNING 0.7 CM IN GREATEST DIMENSION. - MARGIN IS NEGATIVE. - SEE ONCOLOGY TEMPLATE AND COMMENT. 6. Breast, excision, Right Superior Margin #2 - BENIGN BREAST PARENCHYMA. - NO ATYPIA OR TUMOR SEEN. 7. Breast, excision, Right Inferior Margin - BENIGN BREAST PARENCHYMA SHOWING FIBROCYSTIC CHANGES. - NO ATYPIA OR TUMOR SEEN. 8. Breast, excision, Right Posterior Margin - BENIGN BREAST PARENCHYMA. - NO ATYPIA OR TUMOR SEEN. 9. Breast, excision, Right Lateral Margin - BENIGN FIBROFATTY SOFT TISSUE. - NO ATYPIA OR TUMOR SEEN. 10. Breast, excision, Right Anterior Margin - BENIGN FIBROFATTY SOFT TISSUE. - NO ATYPIA OR TUMOR SEEN. 11. Breast, excision, Right Medial Margin - BENIGN BREAST PARENCHYMA. - NO ATYPIA OR TUMOR SEEN. 12. Hardware, Surgical, seed, right breast GROSS DIAGNOSIS ONLY: FINDINGS CONSISTENT WITH RADIOACTIVE SEED   OncoType DX 21%/13%  Receptor Status: ER(100%), PR (95%), Her2-neu (Neg. Ratio 1.29), Ki-(15%)  Ms. Kristy Singh had a diagnostic mammogram and ultrasound 07/04/2015, revealing a 7 mm mass in the 12:30 o'clock position of the right breast. Ultrasound showed a small hypoechoic mass in the same position as the mass, measuring 7 x 4 x 6 mm. The right axilla  showed no enlarged or abnormal lymph nodes.  Past/Anticipated interventions by surgeon, if any: Lumpectomy and Lymph Node Dissection  Past/Anticipated interventions by medical oncology, if any: Chemotherapy: Dr. Nicholas Lose - No Chemotherapy based on Oncotype Dx of 21%/13%  Lymphedema issues, if any:  No  Pain issues, if any: No  SAFETY ISSUES:  Prior radiation? No  Pacemaker/ICD? No  Possible current pregnancy?No  Is the patient on methotrexate? No  Current Complaints / other details:    Menarche16, G2,P2, BC 1 year, Menopause 14, HRT No    [name], [name], RN 08/30/2015,6:29 PM

## 2015-09-02 ENCOUNTER — Telehealth: Payer: Self-pay | Admitting: *Deleted

## 2015-09-02 NOTE — Telephone Encounter (Signed)
"  Someone just called and left a message for me to return call to this number.  I think it is about my appointment Wednesday at 0900."   This nurse advised of RT appointments and if any questions to call office select option number 2 for radiation department.

## 2015-09-04 ENCOUNTER — Ambulatory Visit
Admission: RE | Admit: 2015-09-04 | Discharge: 2015-09-04 | Disposition: A | Discharge status: Home / Self Care | Payer: BLUE CROSS/BLUE SHIELD | Source: Ambulatory Visit | Attending: Radiation Oncology | Admitting: Radiation Oncology

## 2015-09-04 ENCOUNTER — Encounter: Payer: Self-pay | Admitting: Radiation Oncology

## 2015-09-04 VITALS — BP 114/70 | HR 80 | Temp 98.2°F | Ht 65.0 in | Wt 187.1 lb

## 2015-09-04 DIAGNOSIS — C50211 Malignant neoplasm of upper-inner quadrant of right female breast: Secondary | ICD-10-CM

## 2015-09-04 DIAGNOSIS — Z510 Encounter for antineoplastic radiation therapy: Secondary | ICD-10-CM | POA: Diagnosis not present

## 2015-09-04 NOTE — Progress Notes (Signed)
Radiation Oncology         [phone] ________________________________  Follow Up Consultation  Name: Kristy Singh MRN: [id number]   DOB: Oct 05, 1950  REFERRING PHYSICIAN: Nicholas Lose, MD  DIAGNOSIS AND STAGE: Stage IA, T1bN0 ER/PR positive, HER 2 negative invasive ductal carcinoma within the upper-inner quadrant of the right female breast.   HISTORY OF PRESENT ILLNESS::Kristy Singh is a 65 y.o. female who presents after an abnormal routine mammogram. She had a diagnostic mammogram and ultrasound 07/04/2015, revealing a 7 mm mass in the 12:30 o'clock position of the right breast. Ultrasound showed a small hypoechoic mass in the same position as the mass, measuring 7 x 4 x 6 mm. The right axilla showed no enlarged or abnormal lymph nodes. She had a biopsy 07/05/2015, revealing invasive ductal carcinoma, ER (100%), PR (95%), Her2-neu negative, Ki 67 (15%).   She underwent lumpectomy with sentinel lymph node mapping on 08/02/15. A grade 2 invasive ductal carcinoma was noted with negative margins. The tumor was 0.7 cm. Her Oncotype has been collected and was 21. She has been counseled that she does not need chemotherapy. She comes today to further discuss the role of radiotherapy in moving forward under Dr. Ida Rogue care.  PREVIOUS RADIATION THERAPY: No  Past Medical History:  Past Medical History  Diagnosis Date  . GERD (gastroesophageal reflux disease)   . Hyperlipidemia   . Gallstones   . Kidney stones     Dr Dorina Hoyer  . History of gallstones   . History of bronchitis   . Cancer [facility])     right breast cancer  . Numbness and tingling in hands   . Wears glasses     Past Surgical History: Past Surgical History  Procedure Laterality Date  . Cholecystectomy  2008    Dr. Autumn Messing  in [place], had ERCP post -op   . Tonsillectomy    . Abdominal hysterectomy  2006  . Colonoscopy  2005, 2009  . Ercp    . Ovarian cyst removal Right   . Dilation and curettage of uterus  1980  .  Breast lumpectomy with radioactive seed and sentinel lymph node biopsy Right 08/02/2015    Procedure: BREAST LUMPECTOMY WITH RADIOACTIVE SEED AND SENTINEL LYMPH NODE BIOPSY;  Surgeon: Autumn Messing III, MD;  Location: [facility];  Service: General;  Laterality: Right;    Social History:  Social History   Social History  . Marital Status: Married    Spouse Name: N/A  . Number of Children: N/A  . Years of Education: N/A   Occupational History  . computers    Social History Main Topics  . Smoking status: Never Smoker   . Smokeless tobacco: Never Used  . Alcohol Use: No  . Drug Use: No  . Sexual Activity: Yes   Other Topics Concern  . Not on file   Social History Narrative   The patient is married and lives in [place], [place].  Family History: Family History  Problem Relation Age of Onset  . Hypertension Other   . Hypertension Mother   . Hyperlipidemia Mother   . Cancer Mother 68    brest  . Arthritis Father    Medications:  Current outpatient prescriptions:  .  aspirin EC 81 MG tablet, Take 81 mg by mouth daily., Disp: , Rfl:  .  Cholecalciferol (VITAMIN D3) 1000 UNITS tablet, Take 1,000 Units by mouth daily.  , Disp: , Rfl:  .  oxyCODONE-acetaminophen (ROXICET) 5-325 MG tablet, Take 1-2 tablets by mouth  every 4 (four) hours as needed., Disp: 30 tablet, Rfl: 0 .  rosuvastatin (CRESTOR) 10 MG tablet, TAKE 1 TABLET DAILY, Disp: 90 tablet, Rfl: 1  Allergies:No Known Allergies   PHYSICAL EXAM:  There were no vitals taken for this visit. In general this is a well appearing Caucasian female in no acute distress. She's alert and oriented x4 and appropriate throughout the examination. Cardiopulmonary assessment is negative for acute distress and she exhibits normal effort. The right breast is evaluated and reveals an intact lumpectomy scar. No erythema, separation, or ecchymoses are noted.   IMPRESSION: Stage IA, T1bN0 ER/PR positive, HER 2 negative invasive ductal carcinoma within  the upper-inner quadrant of the right female breast.  PLAN: Dr. [name] discusses the findings from the patient's pathology and reviews the rationale for radiotherapy.   The above documentation reflects my direct findings during this shared patient visit. Please see the separate note by Dr. [name] on this date for the remainder of the patient's plan of care.    Carola Rhine, PAC

## 2015-09-04 NOTE — Progress Notes (Signed)
error 

## 2015-09-04 NOTE — Addendum Note (Signed)
Encounter addended by: Benn Moulder, RN on: 09/04/2015  6:37 PM<BR>     Documentation filed: Charges VN

## 2015-09-04 NOTE — Progress Notes (Signed)
Kristy Singh does not report any pain in her surgical area, right breast.

## 2015-09-05 ENCOUNTER — Ambulatory Visit: Payer: BLUE CROSS/BLUE SHIELD

## 2015-09-05 ENCOUNTER — Ambulatory Visit: Payer: BLUE CROSS/BLUE SHIELD | Admitting: Radiation Oncology

## 2015-09-06 ENCOUNTER — Ambulatory Visit
Admission: RE | Admit: 2015-09-06 | Discharge: 2015-09-06 | Disposition: A | Discharge status: Home / Self Care | Payer: BLUE CROSS/BLUE SHIELD | Source: Ambulatory Visit | Attending: Radiation Oncology | Admitting: Radiation Oncology

## 2015-09-06 DIAGNOSIS — C50211 Malignant neoplasm of upper-inner quadrant of right female breast: Secondary | ICD-10-CM

## 2015-09-06 DIAGNOSIS — Z510 Encounter for antineoplastic radiation therapy: Secondary | ICD-10-CM | POA: Diagnosis not present

## 2015-09-13 ENCOUNTER — Ambulatory Visit
Admission: RE | Admit: 2015-09-13 | Discharge: 2015-09-13 | Disposition: A | Discharge status: Home / Self Care | Payer: BLUE CROSS/BLUE SHIELD | Source: Ambulatory Visit | Attending: Radiation Oncology | Admitting: Radiation Oncology

## 2015-09-13 DIAGNOSIS — Z510 Encounter for antineoplastic radiation therapy: Secondary | ICD-10-CM | POA: Diagnosis not present

## 2015-09-16 ENCOUNTER — Ambulatory Visit
Admission: RE | Admit: 2015-09-16 | Discharge: 2015-09-16 | Disposition: A | Discharge status: Home / Self Care | Payer: BLUE CROSS/BLUE SHIELD | Source: Ambulatory Visit | Attending: Radiation Oncology | Admitting: Radiation Oncology

## 2015-09-16 DIAGNOSIS — Z510 Encounter for antineoplastic radiation therapy: Secondary | ICD-10-CM | POA: Diagnosis not present

## 2015-09-17 ENCOUNTER — Encounter: Payer: Self-pay | Admitting: Radiation Oncology

## 2015-09-17 ENCOUNTER — Ambulatory Visit
Admission: RE | Admit: 2015-09-17 | Discharge: 2015-09-17 | Disposition: A | Discharge status: Home / Self Care | Payer: BLUE CROSS/BLUE SHIELD | Source: Ambulatory Visit | Attending: Radiation Oncology | Admitting: Radiation Oncology

## 2015-09-17 DIAGNOSIS — Z510 Encounter for antineoplastic radiation therapy: Secondary | ICD-10-CM | POA: Diagnosis not present

## 2015-09-18 ENCOUNTER — Ambulatory Visit
Admission: RE | Admit: 2015-09-18 | Discharge: 2015-09-18 | Disposition: A | Discharge status: Home / Self Care | Payer: BLUE CROSS/BLUE SHIELD | Source: Ambulatory Visit | Attending: Radiation Oncology | Admitting: Radiation Oncology

## 2015-09-18 DIAGNOSIS — C50211 Malignant neoplasm of upper-inner quadrant of right female breast: Secondary | ICD-10-CM

## 2015-09-18 DIAGNOSIS — Z510 Encounter for antineoplastic radiation therapy: Secondary | ICD-10-CM | POA: Diagnosis not present

## 2015-09-18 MED ORDER — ALRA NON-METALLIC DEODORANT (RAD-ONC)
1.0000 "application " | Freq: Once | TOPICAL | Status: AC
  Administered 2015-09-18: 1 via TOPICAL

## 2015-09-18 MED ORDER — RADIAPLEXRX EX GEL
Freq: Once | CUTANEOUS | Status: AC
  Administered 2015-09-18: 09:00:00 via TOPICAL

## 2015-09-18 NOTE — Progress Notes (Signed)
Pt education done, radiation therapy and you book, my business card, radiaplex and alra given , discussed ways to manage side effects, fatigue, skin irritation, pain, swelling, soreness, increase protein in diet,stay hydrated, use electric shaver, apply radiaplex bid after rad tx and bedtime, deodorant alra after rad tx and prn, teach back given

## 2015-09-19 ENCOUNTER — Ambulatory Visit
Admission: RE | Admit: 2015-09-19 | Discharge: 2015-09-19 | Disposition: A | Discharge status: Home / Self Care | Payer: BLUE CROSS/BLUE SHIELD | Source: Ambulatory Visit | Attending: Radiation Oncology | Admitting: Radiation Oncology

## 2015-09-19 DIAGNOSIS — Z510 Encounter for antineoplastic radiation therapy: Secondary | ICD-10-CM | POA: Diagnosis not present

## 2015-09-20 ENCOUNTER — Ambulatory Visit
Admission: RE | Admit: 2015-09-20 | Discharge: 2015-09-20 | Disposition: A | Discharge status: Home / Self Care | Payer: BLUE CROSS/BLUE SHIELD | Source: Ambulatory Visit | Attending: Radiation Oncology | Admitting: Radiation Oncology

## 2015-09-20 ENCOUNTER — Encounter: Payer: Self-pay | Admitting: Radiation Oncology

## 2015-09-20 VITALS — BP 117/79 | HR 54 | Temp 97.9°F | Ht 65.0 in | Wt 184.1 lb

## 2015-09-20 DIAGNOSIS — C50211 Malignant neoplasm of upper-inner quadrant of right female breast: Secondary | ICD-10-CM

## 2015-09-20 DIAGNOSIS — Z510 Encounter for antineoplastic radiation therapy: Secondary | ICD-10-CM | POA: Diagnosis not present

## 2015-09-20 NOTE — Progress Notes (Signed)
   Department of Radiation Oncology  Phone:  [phone] Fax:        [phone]  Weekly Treatment Note    Name: Kristy Singh Date: 09/20/2015 MRN: [id number] DOB: 1950-08-05   Diagnosis:     ICD-9-CM ICD-10-CM   1. Breast cancer of upper-inner quadrant of right female breast ([place]) 174.2 C50.211      Current dose: 12.5 Gy  Current fraction: 5   MEDICATIONS: Current Outpatient Prescriptions  Medication Sig Dispense Refill  . aspirin EC 81 MG tablet Take 81 mg by mouth daily.    . Cholecalciferol (VITAMIN D3) 1000 UNITS tablet Take 1,000 Units by mouth daily.      . rosuvastatin (CRESTOR) 10 MG tablet TAKE 1 TABLET DAILY 90 tablet 1   No current facility-administered medications for this encounter.      ALLERGIES: Review of patient's allergies indicates no known allergies.   LABORATORY DATA:  Lab Results  Component Value Date   WBC 5.2 07/30/2015   HGB 14.1 07/30/2015   HCT 42.2 07/30/2015   MCV 90.9 07/30/2015   PLT 308 07/30/2015   Lab Results  Component Value Date   NA 143 11/28/2013   K 4.8 11/28/2013   CL 108 11/28/2013   CO2 25 11/28/2013   Lab Results  Component Value Date   ALT 29 11/28/2013   AST 25 11/28/2013   ALKPHOS 89 11/28/2013   BILITOT 0.7 11/28/2013     NARRATIVE: Kristy Singh was seen today for weekly treatment management. The chart was checked and the patient's films were reviewed. Kristy Singh has completed 5 fractions to her right breast.  She denies having pain and fatigue.  She is using radiaplex gel as directed.  The skin on her right breast is pink with slight hyperpigmentation over the nipple area.    PHYSICAL EXAMINATION: height is 5\' 5"  (1.651 m) and weight is 184 lb 1.6 oz (83.5 kg). Her oral temperature is 97.9 F (36.6 C). Her blood pressure is 117/79 and her pulse is 54 (abnormal). Her oxygen saturation is 100%.        ASSESSMENT: The patient is doing satisfactorily with treatment.  PLAN: We will continue with  the patient's radiation treatment as planned.        This document serves as a record of services personally performed by Tyler Pita, MD. It was created on his behalf by Truddie Hidden, a trained medical scribe. The creation of this record is based on the scribe's personal observations and the provider's statements to them. This document has been checked and approved by the attending provider.

## 2015-09-20 NOTE — Progress Notes (Signed)
Kristy Singh has completed 5 fractions to her right breast.  She denies having pain and fatigue.  She is using radiaplex gel as directed.  The skin on her right breast is pink with slight hyperpigmentation over the nipple area.  BP 117/79 (BP Location: Left Arm, Patient Position: Sitting)   Pulse (!) 54   Temp 97.9 F (36.6 C) (Oral)   Ht 5\' 5"  (1.651 m)   Wt 184 lb 1.6 oz (83.5 kg)   SpO2 100%   BMI 30.64 kg/m    Wt Readings from Last 3 Encounters:  09/20/15 184 lb 1.6 oz (83.5 kg)  09/04/15 187 lb 1.6 oz (84.9 kg)  08/12/15 184 lb 12.8 oz (83.8 kg)

## 2015-09-23 ENCOUNTER — Telehealth: Payer: Self-pay | Admitting: *Deleted

## 2015-09-23 ENCOUNTER — Ambulatory Visit
Admission: RE | Admit: 2015-09-23 | Discharge: 2015-09-23 | Disposition: A | Discharge status: Home / Self Care | Payer: BLUE CROSS/BLUE SHIELD | Source: Ambulatory Visit | Attending: Radiation Oncology | Admitting: Radiation Oncology

## 2015-09-23 DIAGNOSIS — Z510 Encounter for antineoplastic radiation therapy: Secondary | ICD-10-CM | POA: Diagnosis not present

## 2015-09-23 NOTE — Telephone Encounter (Signed)
  Oncology Nurse Navigator Documentation  Navigator Location: [facility] (09/23/15 1400) Navigator Encounter Type: Telephone (09/23/15 1400) Telephone: [place] Call (09/23/15 1400)     Surgery Date: 08/02/15 (09/23/15 1400) Treatment Initiated Date: 08/02/15 (09/23/15 1400) Patient Visit Type: RadOnc (09/23/15 1400) Treatment Phase: First Radiation Tx (09/23/15 1400)                            Time Spent with Patient: 15 (09/23/15 1400)

## 2015-09-24 ENCOUNTER — Ambulatory Visit
Admission: RE | Admit: 2015-09-24 | Discharge: 2015-09-24 | Disposition: A | Discharge status: Home / Self Care | Payer: BLUE CROSS/BLUE SHIELD | Source: Ambulatory Visit | Attending: Radiation Oncology | Admitting: Radiation Oncology

## 2015-09-24 DIAGNOSIS — Z510 Encounter for antineoplastic radiation therapy: Secondary | ICD-10-CM | POA: Diagnosis not present

## 2015-09-25 ENCOUNTER — Ambulatory Visit
Admission: RE | Admit: 2015-09-25 | Discharge: 2015-09-25 | Disposition: A | Discharge status: Home / Self Care | Payer: BLUE CROSS/BLUE SHIELD | Source: Ambulatory Visit | Attending: Radiation Oncology | Admitting: Radiation Oncology

## 2015-09-25 DIAGNOSIS — Z510 Encounter for antineoplastic radiation therapy: Secondary | ICD-10-CM | POA: Diagnosis not present

## 2015-09-26 ENCOUNTER — Ambulatory Visit
Admission: RE | Admit: 2015-09-26 | Discharge: 2015-09-26 | Disposition: A | Discharge status: Home / Self Care | Payer: BLUE CROSS/BLUE SHIELD | Source: Ambulatory Visit | Attending: Radiation Oncology | Admitting: Radiation Oncology

## 2015-09-26 DIAGNOSIS — Z510 Encounter for antineoplastic radiation therapy: Secondary | ICD-10-CM | POA: Diagnosis not present

## 2015-09-27 ENCOUNTER — Ambulatory Visit
Admission: RE | Admit: 2015-09-27 | Discharge: 2015-09-27 | Disposition: A | Discharge status: Home / Self Care | Payer: BLUE CROSS/BLUE SHIELD | Source: Ambulatory Visit | Attending: Radiation Oncology | Admitting: Radiation Oncology

## 2015-09-27 ENCOUNTER — Encounter: Payer: Self-pay | Admitting: Radiation Oncology

## 2015-09-27 VITALS — BP 117/68 | HR 63 | Temp 97.9°F | Ht 65.0 in | Wt 182.5 lb

## 2015-09-27 DIAGNOSIS — Z510 Encounter for antineoplastic radiation therapy: Secondary | ICD-10-CM | POA: Diagnosis not present

## 2015-09-27 DIAGNOSIS — C50211 Malignant neoplasm of upper-inner quadrant of right female breast: Secondary | ICD-10-CM

## 2015-09-27 NOTE — Progress Notes (Signed)
  Radiation Oncology         ([phone] ________________________________  Name: Kristy Singh MRN: [id number]  Date: 09/06/2015  DOB: 06-17-1950   DIAGNOSIS:     ICD-9-CM ICD-10-CM   1. Breast cancer of upper-inner quadrant of right female breast ([place]) 174.2 C50.211     SIMULATION AND TREATMENT PLANNING NOTE  The patient presented for simulation prior to beginning her course of radiation treatment for her diagnosis of Right-sided breast cancer. The patient was placed in a supine position on a breast board. A customized vac-lock bag was constructed and this complex treatment device will be used on a daily basis during her treatment. In this fashion, a CT scan was obtained through the chest area and an isocenter was placed near the chest wall within the breast.  The patient will be planned to receive a course of radiation initially to a dose of 42.5 Gy. This will consist of a whole breast radiotherapy technique. To accomplish this, 2 customized blocks have been designed which will correspond to medial and lateral whole breast tangent fields. This treatment will be accomplished at 2.5 Gy per fraction. A forward planning technique will also be evaluated to determine if this approach improves the plan. It is anticipated that the patient will then receive a 7.5 Gy boost to the seroma cavity which has been contoured. This will be accomplished at 2.5 Gy per fraction.   This initial treatment will consist of a 3-D conformal technique. The seroma has been contoured as the primary target structure. Additionally, dose volume histograms of both this target as well as the lungs and heart will also be evaluated. Such an approach is necessary to ensure that the target area is adequately covered while the nearby critical  normal structures are adequately spared.  Plan:  The final anticipated total dose therefore will correspond to 50 Gy.    _______________________________   Jodelle Gross, MD, PhD

## 2015-09-27 NOTE — Progress Notes (Signed)
   Department of Radiation Oncology  Phone:  [phone] Fax:        [phone]  Weekly Treatment Note    Name: Kristy Singh Date: 09/27/2015 MRN: [id number] DOB: May 21, 1950   Diagnosis:     ICD-9-CM ICD-10-CM   1. Breast cancer of upper-inner quadrant of right female breast ([place]) 174.2 C50.211      Current dose: 25 Gy  Current fraction: 10   MEDICATIONS: Current Outpatient Prescriptions  Medication Sig Dispense Refill  . aspirin EC 81 MG tablet Take 81 mg by mouth daily.    . Cholecalciferol (VITAMIN D3) 1000 UNITS tablet Take 1,000 Units by mouth daily.      . rosuvastatin (CRESTOR) 10 MG tablet TAKE 1 TABLET DAILY 90 tablet 1   No current facility-administered medications for this encounter.      ALLERGIES: Review of patient's allergies indicates no known allergies.   LABORATORY DATA:  Lab Results  Component Value Date   WBC 5.2 07/30/2015   HGB 14.1 07/30/2015   HCT 42.2 07/30/2015   MCV 90.9 07/30/2015   PLT 308 07/30/2015   Lab Results  Component Value Date   NA 143 11/28/2013   K 4.8 11/28/2013   CL 108 11/28/2013   CO2 25 11/28/2013   Lab Results  Component Value Date   ALT 29 11/28/2013   AST 25 11/28/2013   ALKPHOS 89 11/28/2013   BILITOT 0.7 11/28/2013     NARRATIVE: Kristy Singh was seen today for weekly treatment management. The chart was checked and the patient's films were reviewed.  Kristy Singh is here for her 10th fraction of radiation to her Right Breast. She denies fatigue or pain. She is exercising and drinking protein drinks. She is working full time. Her Right Breast is slightly red, and swollen. She is using the Radiaplex twice daily.   BP 117/68   Pulse 63   Temp 97.9 F (36.6 C)   Ht 5\' 5"  (1.651 m)   Wt 182 lb 8 oz (82.8 kg)   BMI 30.37 kg/m    Wt Readings from Last 3 Encounters:  09/27/15 182 lb 8 oz (82.8 kg)  09/20/15 184 lb 1.6 oz (83.5 kg)  09/04/15 187 lb 1.6 oz (84.9 kg)    PHYSICAL EXAMINATION: height  is 5\' 5"  (1.651 m) and weight is 182 lb 8 oz (82.8 kg). Her temperature is 97.9 F (36.6 C). Her blood pressure is 117/68 and her pulse is 63.      Minimal hyperpigmentation present  ASSESSMENT: The patient is doing satisfactorily with treatment.  PLAN: We will continue with the patient's radiation treatment as planned.

## 2015-09-27 NOTE — Progress Notes (Signed)
  Radiation Oncology         [phone] ________________________________  Name: Kristy Singh MRN: [id number]  Date: 09/06/2015  DOB: September 13, 1950  Optical Surface Tracking Plan:  Since intensity modulated radiotherapy (IMRT) and 3D conformal radiation treatment methods are predicated on accurate and precise positioning for treatment, intrafraction motion monitoring is medically necessary to ensure accurate and safe treatment delivery.  The ability to quantify intrafraction motion without excessive ionizing radiation dose can only be performed with optical surface tracking. Accordingly, surface imaging offers the opportunity to obtain 3D measurements of patient position throughout IMRT and 3D treatments without excessive radiation exposure.  I am ordering optical surface tracking for this patient's upcoming course of radiotherapy. ________________________________  Kyung Rudd, MD 09/27/2015 10:07 AM    Reference:   Particia Jasper, et al. Surface imaging-based analysis of intrafraction motion for breast radiotherapy patients.Journal of [place], n. 6, nov. 2014. ISSN [id number].   Available at: <http://www.jacmp.org/index.php/jacmp/article/view/4957>.

## 2015-09-27 NOTE — Addendum Note (Signed)
Encounter addended by: Kyung Rudd, MD on: 09/27/2015 10:07 AM<BR>    Actions taken: Visit diagnoses modified, Sign clinical note

## 2015-09-27 NOTE — Progress Notes (Signed)
Kristy Singh is here for her 10th fraction of radiation to her Right Breast. She denies fatigue or pain. She is exercising and drinking protein drinks. She is working full time. Her Right Breast is slightly red, and swollen. She is using the Radiaplex twice daily.   BP 117/68   Pulse 63   Temp 97.9 F (36.6 C)   Ht 5\' 5"  (1.651 m)   Wt 182 lb 8 oz (82.8 kg)   BMI 30.37 kg/m    Wt Readings from Last 3 Encounters:  09/27/15 182 lb 8 oz (82.8 kg)  09/20/15 184 lb 1.6 oz (83.5 kg)  09/04/15 187 lb 1.6 oz (84.9 kg)

## 2015-09-30 ENCOUNTER — Ambulatory Visit
Admission: RE | Admit: 2015-09-30 | Discharge: 2015-09-30 | Disposition: A | Discharge status: Home / Self Care | Payer: BLUE CROSS/BLUE SHIELD | Source: Ambulatory Visit | Attending: Radiation Oncology | Admitting: Radiation Oncology

## 2015-09-30 DIAGNOSIS — Z510 Encounter for antineoplastic radiation therapy: Secondary | ICD-10-CM | POA: Diagnosis not present

## 2015-10-01 ENCOUNTER — Ambulatory Visit
Admission: RE | Admit: 2015-10-01 | Discharge: 2015-10-01 | Disposition: A | Discharge status: Home / Self Care | Payer: BLUE CROSS/BLUE SHIELD | Source: Ambulatory Visit | Attending: Radiation Oncology | Admitting: Radiation Oncology

## 2015-10-01 DIAGNOSIS — Z510 Encounter for antineoplastic radiation therapy: Secondary | ICD-10-CM | POA: Diagnosis not present

## 2015-10-02 ENCOUNTER — Ambulatory Visit
Admission: RE | Admit: 2015-10-02 | Discharge: 2015-10-02 | Disposition: A | Discharge status: Home / Self Care | Payer: BLUE CROSS/BLUE SHIELD | Source: Ambulatory Visit | Attending: Radiation Oncology | Admitting: Radiation Oncology

## 2015-10-02 DIAGNOSIS — Z510 Encounter for antineoplastic radiation therapy: Secondary | ICD-10-CM | POA: Diagnosis not present

## 2015-10-03 ENCOUNTER — Ambulatory Visit
Admission: RE | Admit: 2015-10-03 | Discharge: 2015-10-03 | Disposition: A | Discharge status: Home / Self Care | Payer: BLUE CROSS/BLUE SHIELD | Source: Ambulatory Visit | Attending: Radiation Oncology | Admitting: Radiation Oncology

## 2015-10-03 DIAGNOSIS — Z510 Encounter for antineoplastic radiation therapy: Secondary | ICD-10-CM | POA: Diagnosis not present

## 2015-10-04 ENCOUNTER — Ambulatory Visit
Admission: RE | Admit: 2015-10-04 | Discharge: 2015-10-04 | Disposition: A | Discharge status: Home / Self Care | Payer: BLUE CROSS/BLUE SHIELD | Source: Ambulatory Visit | Attending: Radiation Oncology | Admitting: Radiation Oncology

## 2015-10-04 DIAGNOSIS — C50211 Malignant neoplasm of upper-inner quadrant of right female breast: Secondary | ICD-10-CM

## 2015-10-04 DIAGNOSIS — Z510 Encounter for antineoplastic radiation therapy: Secondary | ICD-10-CM | POA: Diagnosis not present

## 2015-10-04 NOTE — Progress Notes (Signed)
Md saw patient on [place] table, not sent to nursing for assessment 8:28 AM

## 2015-10-04 NOTE — Progress Notes (Signed)
   Department of Radiation Oncology  Phone:  [phone] Fax:        [phone]  Weekly Treatment Note    Name: Kristy Singh Date: 10/06/2015 MRN: [id number] DOB: 1950/11/03   Diagnosis:     ICD-9-CM ICD-10-CM   1. Breast cancer of upper-inner quadrant of right female breast ([place]) 174.2 C50.211      Current dose: 37.5 Gy  Current fraction: 15   MEDICATIONS: Current Outpatient Prescriptions  Medication Sig Dispense Refill  . aspirin EC 81 MG tablet Take 81 mg by mouth daily.    . Cholecalciferol (VITAMIN D3) 1000 UNITS tablet Take 1,000 Units by mouth daily.      . rosuvastatin (CRESTOR) 10 MG tablet TAKE 1 TABLET DAILY 90 tablet 1   No current facility-administered medications for this encounter.      ALLERGIES: Review of patient's allergies indicates no known allergies.   LABORATORY DATA:  Lab Results  Component Value Date   WBC 5.2 07/30/2015   HGB 14.1 07/30/2015   HCT 42.2 07/30/2015   MCV 90.9 07/30/2015   PLT 308 07/30/2015   Lab Results  Component Value Date   NA 143 11/28/2013   K 4.8 11/28/2013   CL 108 11/28/2013   CO2 25 11/28/2013   Lab Results  Component Value Date   ALT 29 11/28/2013   AST 25 11/28/2013   ALKPHOS 89 11/28/2013   BILITOT 0.7 11/28/2013     NARRATIVE: Kristy Singh was seen today for weekly treatment management. The chart was checked and the patient's films were reviewed.  She was having her boost set up today.  There were no vitals taken for this visit.   Wt Readings from Last 3 Encounters:  09/27/15 182 lb 8 oz (82.8 kg)  09/20/15 184 lb 1.6 oz (83.5 kg)  09/04/15 187 lb 1.6 oz (84.9 kg)    PHYSICAL EXAMINATION: vitals were not taken for this visit.     Skin looks excellent without any desquamation.  ASSESSMENT: The patient is doing satisfactorily with treatment.  PLAN: We will continue with the patient's radiation treatment as planned.     This document serves as a record of services personally  performed by Kyung Rudd, MD. It was created on his behalf by Arlyce Harman, a trained medical scribe. The creation of this record is based on the scribe's personal observations and the provider's statements to them. This document has been checked and approved by the attending provider.  ------------------------------------------------  Jodelle Gross, MD, PhD

## 2015-10-07 ENCOUNTER — Ambulatory Visit
Admission: RE | Admit: 2015-10-07 | Discharge: 2015-10-07 | Disposition: A | Discharge status: Home / Self Care | Payer: BLUE CROSS/BLUE SHIELD | Source: Ambulatory Visit | Attending: Radiation Oncology | Admitting: Radiation Oncology

## 2015-10-07 DIAGNOSIS — Z510 Encounter for antineoplastic radiation therapy: Secondary | ICD-10-CM | POA: Diagnosis not present

## 2015-10-08 ENCOUNTER — Ambulatory Visit
Admission: RE | Admit: 2015-10-08 | Discharge: 2015-10-08 | Disposition: A | Discharge status: Home / Self Care | Payer: BLUE CROSS/BLUE SHIELD | Source: Ambulatory Visit | Attending: Radiation Oncology | Admitting: Radiation Oncology

## 2015-10-08 DIAGNOSIS — Z510 Encounter for antineoplastic radiation therapy: Secondary | ICD-10-CM | POA: Diagnosis not present

## 2015-10-09 ENCOUNTER — Ambulatory Visit
Admission: RE | Admit: 2015-10-09 | Discharge: 2015-10-09 | Disposition: A | Discharge status: Home / Self Care | Payer: BLUE CROSS/BLUE SHIELD | Source: Ambulatory Visit | Attending: Radiation Oncology | Admitting: Radiation Oncology

## 2015-10-09 ENCOUNTER — Encounter: Payer: Self-pay | Admitting: Radiation Oncology

## 2015-10-09 VITALS — BP 109/61 | HR 65 | Temp 98.2°F | Resp 16 | Ht 65.0 in | Wt 181.8 lb

## 2015-10-09 DIAGNOSIS — C50211 Malignant neoplasm of upper-inner quadrant of right female breast: Secondary | ICD-10-CM

## 2015-10-09 DIAGNOSIS — Z510 Encounter for antineoplastic radiation therapy: Secondary | ICD-10-CM | POA: Diagnosis not present

## 2015-10-09 NOTE — Progress Notes (Signed)
Department of Radiation Oncology  Phone:  [phone] Fax:        [phone]  Weekly Treatment Note    Name: Kristy Singh Date: 10/09/2015 MRN: [id number] DOB: 08-02-1950   Diagnosis:   No diagnosis found.   Current dose: 45 Gy  Current fraction: 18   MEDICATIONS: Current Outpatient Prescriptions  Medication Sig Dispense Refill  . aspirin EC 81 MG tablet Take 81 mg by mouth daily.    . Cholecalciferol (VITAMIN D3) 1000 UNITS tablet Take 1,000 Units by mouth daily.      . hyaluronate sodium (RADIAPLEXRX) GEL Apply 1 application topically 2 (two) times daily.    . non-metallic deodorant Kristy Singh) MISC Apply 1 application topically daily as needed.    . rosuvastatin (CRESTOR) 10 MG tablet TAKE 1 TABLET DAILY 90 tablet 1   No current facility-administered medications for this encounter.      ALLERGIES: Review of patient's allergies indicates no known allergies.   LABORATORY DATA:  Lab Results  Component Value Date   WBC 5.2 07/30/2015   HGB 14.1 07/30/2015   HCT 42.2 07/30/2015   MCV 90.9 07/30/2015   PLT 308 07/30/2015   Lab Results  Component Value Date   NA 143 11/28/2013   K 4.8 11/28/2013   CL 108 11/28/2013   CO2 25 11/28/2013   Lab Results  Component Value Date   ALT 29 11/28/2013   AST 25 11/28/2013   ALKPHOS 89 11/28/2013   BILITOT 0.7 11/28/2013     NARRATIVE: Kristy Singh was seen today for weekly treatment management. The chart was checked and the patient's films were reviewed.  Kristy Singh is here for her 18th fraction to her right breast. She denies fatigue or pain. She is exercising and drinking protein rinks, appetite is good. She is working full time. Her right breast is slightly red, had boost today. She is using the Radiaplex twice daily. EOT and skin care instructions given.  BP 109/61 (BP Location: Left Arm, Patient Position: Sitting, Cuff Size: Normal)   Pulse 65   Temp 98.2 F (36.8 C) (Oral)   Resp 16   Ht 5\' 5"  (1.651 m)    Wt 181 lb 12.8 oz (82.5 kg)   SpO2 97%   BMI 30.25 kg/m    Wt Readings from Last 3 Encounters:  10/09/15 181 lb 12.8 oz (82.5 kg)  09/27/15 182 lb 8 oz (82.8 kg)  09/20/15 184 lb 1.6 oz (83.5 kg)    PHYSICAL EXAMINATION: height is 5\' 5"  (1.651 m) and weight is 181 lb 12.8 oz (82.5 kg). Her oral temperature is 98.2 F (36.8 C). Her blood pressure is 109/61 and her pulse is 65. Her respiration is 16 and oxygen saturation is 97%.   Erythema/dermatitis present in the treatment area but overall the patient's skin looks excellent for where she is in her treatment. No desquamation.  ASSESSMENT: The patient is doing satisfactorily with treatment.  PLAN: We will continue with the patient's radiation treatment as planned. She finishes treatment 10/11/2015 and has been given a one month follow up appointment with Kristy Singh, PAC.  ------------------------------------------------  Jodelle Gross, MD, PhD    This document serves as a record of services personally performed by Kyung Rudd, MD. It was created on his behalf by Lendon Collar, a trained medical scribe. The creation of this record is based on the scribe's personal observations and the provider's statements to them. This document has been checked and approved by the attending provider.

## 2015-10-09 NOTE — Progress Notes (Addendum)
Kristy Singh is here for her 18h fraction of radiation to her Right Breast. She denies fatigue or pain. She is exercising and drinking protein drinks, appetite is good. She is working full time. Her Right Breast is slightly red had boost today. She is using the Radiaplex twice daily.  EOT instructions given skin care and one month card to see Shona Simpson, PA-C. BP 109/61 (BP Location: Left Arm, Patient Position: Sitting, Cuff Size: Normal)   Pulse 65   Temp 98.2 F (36.8 C) (Oral)   Resp 16   Ht 5\' 5"  (1.651 m)   Wt 181 lb 12.8 oz (82.5 kg)   SpO2 97%   BMI 30.25 kg/m

## 2015-10-09 NOTE — Progress Notes (Signed)
  Radiation Oncology         [phone] ________________________________  Name: Kristy Singh MRN: [id number]  Date: 09/17/2015  DOB: 07-Nov-1950  Complex simulation note  The patient has undergone complex simulation for her upcoming boost treatment for her diagnosis of breast cancer. The patient has initially been planned to receive 42.5 Gy. The patient will now receive a 7.5 Gy boost to the seroma cavity which has been contoured. This will be accomplished using an en face electron field. Based on the depth of the target area, 12 MeV electrons will be used. The patient's final total dose therefore will be 50 Gy. A complex isodose plan from the electron [facility] Carlo calculation is requested for the boost treatment.   _______________________________  Jodelle Gross, MD, PhD

## 2015-10-10 ENCOUNTER — Ambulatory Visit
Admission: RE | Admit: 2015-10-10 | Discharge: 2015-10-10 | Disposition: A | Discharge status: Home / Self Care | Payer: BLUE CROSS/BLUE SHIELD | Source: Ambulatory Visit | Attending: Radiation Oncology | Admitting: Radiation Oncology

## 2015-10-10 DIAGNOSIS — Z510 Encounter for antineoplastic radiation therapy: Secondary | ICD-10-CM | POA: Diagnosis not present

## 2015-10-11 ENCOUNTER — Encounter: Payer: Self-pay | Admitting: Radiation Oncology

## 2015-10-11 ENCOUNTER — Ambulatory Visit
Admission: RE | Admit: 2015-10-11 | Discharge: 2015-10-11 | Disposition: A | Discharge status: Home / Self Care | Payer: BLUE CROSS/BLUE SHIELD | Source: Ambulatory Visit | Attending: Radiation Oncology | Admitting: Radiation Oncology

## 2015-10-11 ENCOUNTER — Telehealth: Payer: Self-pay | Admitting: *Deleted

## 2015-10-11 DIAGNOSIS — Z510 Encounter for antineoplastic radiation therapy: Secondary | ICD-10-CM | POA: Diagnosis not present

## 2015-10-11 DIAGNOSIS — C50211 Malignant neoplasm of upper-inner quadrant of right female breast: Secondary | ICD-10-CM

## 2015-10-11 NOTE — Telephone Encounter (Signed)
  Oncology Nurse Navigator Documentation  Navigator Location: [facility]Med Onc (10/11/15 1500) Navigator Encounter Type: Telephone (10/11/15 1500) Telephone: Outgoing Call (10/11/15 1500)         Patient Visit Type: RadOnc (10/11/15 1500) Treatment Phase: Final Radiation Tx (10/11/15 1500)                            Time Spent with Patient: 15 (10/11/15 1500)

## 2015-10-28 NOTE — Progress Notes (Signed)
  Radiation Oncology         [phone] ________________________________  Name: Kristy Singh MRN: [id number]  Date: 10/11/2015  DOB: 1950/03/18  End of Treatment Note  Diagnosis:   Right-sided breast cancer     Indication for treatment:  Curative       Radiation treatment dates:   09/16/2015 through 10/11/2015  Site/dose:   The patient initially received a dose of 42.5 Gy in 17 fractions to the breast using whole-breast tangent fields. This was delivered using a 3-D conformal technique. The patient then received a boost to the seroma. This delivered an additional 7.5 Gy in 3 fractions using an en face electron field due to the depth of the seroma. The total dose was 50 Gy.  Narrative: The patient tolerated radiation treatment relatively well.   The patient had some expected skin irritation as she progressed during treatment. Moist desquamation was not present at the end of treatment.  Plan: The patient has completed radiation treatment. The patient will return to radiation oncology clinic for routine followup in one month. I advised the patient to call or return sooner if they have any questions or concerns related to their recovery or treatment. ________________________________  Jodelle Gross, M.D., Ph.D.

## 2015-11-12 ENCOUNTER — Other Ambulatory Visit: Payer: Self-pay | Admitting: Radiation Oncology

## 2015-11-12 ENCOUNTER — Encounter: Payer: Self-pay | Admitting: Radiation Oncology

## 2015-11-12 ENCOUNTER — Ambulatory Visit
Admission: RE | Admit: 2015-11-12 | Discharge: 2015-11-12 | Disposition: A | Discharge status: Home / Self Care | Payer: BLUE CROSS/BLUE SHIELD | Source: Ambulatory Visit | Attending: Radiation Oncology | Admitting: Radiation Oncology

## 2015-11-12 DIAGNOSIS — C50911 Malignant neoplasm of unspecified site of right female breast: Secondary | ICD-10-CM | POA: Diagnosis not present

## 2015-11-12 DIAGNOSIS — Z7982 Long term (current) use of aspirin: Secondary | ICD-10-CM | POA: Insufficient documentation

## 2015-11-12 DIAGNOSIS — Z79899 Other long term (current) drug therapy: Secondary | ICD-10-CM | POA: Diagnosis not present

## 2015-11-12 DIAGNOSIS — C50211 Malignant neoplasm of upper-inner quadrant of right female breast: Secondary | ICD-10-CM

## 2015-11-12 DIAGNOSIS — Z170 Estrogen receptor positive status [ER+]: Secondary | ICD-10-CM | POA: Insufficient documentation

## 2015-11-12 NOTE — Progress Notes (Addendum)
Kristy Singh here for reassessment S/P XRT to her right breast.  She reports intermittent soreness in the posterior axillary region.  She denies any fatigue.  Working full-time.  Eating healthier -weight watchers.    BP (!) 117/53 (BP Location: Left Arm, Patient Position: Sitting, Cuff Size: Normal)   Pulse 62   Temp 97.8 F (36.6 C) (Oral)   Ht 5\' 5"  (1.651 m)   Wt 180 lb 14.4 oz (82.1 kg)   BMI 30.10 kg/m    Wt Readings from Last 3 Encounters:  11/12/15 180 lb 14.4 oz (82.1 kg)  10/09/15 181 lb 12.8 oz (82.5 kg)  09/27/15 182 lb 8 oz (82.8 kg)

## 2015-11-12 NOTE — Progress Notes (Signed)
  Radiation Oncology         [phone] ________________________________  Name: Kristy Singh MRN: [id number]  Date: 11/12/2015  DOB: 26-Jan-1951  Post Treatment Note  CC: Walker Kehr, MD  Autumn Messing III, MD  Diagnosis:   Stage IA,  T1b, N0, M0, ER/PR positive invasive ductal carcinoma of the right breast.   Interval Since Last Radiation:  4 weeks   09/16/2015 through 10/11/2015: The patient initially received a dose of 42.5 Gy in 17 fractions to the breast using whole-breast tangent fields. This was delivered using a 3-D conformal technique. The patient then received a boost to the seroma. This delivered an additional 7.5 Gy in 3 fractions using an en face electron field due to the depth of the seroma. The total dose was 50 Gy.  Narrative:  The patient returns today for routine follow-up.  She tolerated radiotherapy well without skin issues.                             On review of systems, the patient states she is doing great. She has minimal tanning underneath her right axilla, and occasionally notes some soreness. She maintains full range of motion. No other complaints are noted.  ALLERGIES:  has No Known Allergies.  Meds: Current Outpatient Prescriptions  Medication Sig Dispense Refill  . aspirin EC 81 MG tablet Take 81 mg by mouth daily.    . Cholecalciferol (VITAMIN D3) 1000 UNITS tablet Take 1,000 Units by mouth daily.      . hyaluronate sodium (RADIAPLEXRX) GEL Apply 1 application topically 2 (two) times daily.    . non-metallic deodorant Jethro Poling) MISC Apply 1 application topically daily as needed.    . rosuvastatin (CRESTOR) 10 MG tablet TAKE 1 TABLET DAILY 90 tablet 1   No current facility-administered medications for this encounter.     Physical Findings:  height is 5\' 5"  (1.651 m) and weight is 180 lb 14.4 oz (82.1 kg). Her oral temperature is 97.8 F (36.6 C). Her blood pressure is 117/53 (abnormal) and her pulse is 62.  In general this is a well appearing  Caucasian female in no acute distress. She's alert and oriented x4 and appropriate throughout the examination. Cardiopulmonary assessment is negative for acute distress and she exhibits normal effort. The right breast is intact with minimal hyperpigmentation beneath the axilla. No desquamation is present.  Lab Findings: Lab Results  Component Value Date   WBC 5.2 07/30/2015   HGB 14.1 07/30/2015   HCT 42.2 07/30/2015   MCV 90.9 07/30/2015   PLT 308 07/30/2015     Radiographic Findings: No results found.  Impression/Plan: 1. Stage IA,  T1b, N0, M0, ER/PR positive invasive ductal carcinoma of the right breast. The patient is due to meet Dr. Lindi Adie in October. I've sent a message to her navigator to see if she needs to start estrogen blockade any sooner than that. She has healed well from treatment, and we will release her to prn visits. She is encouraged to let us know if she has any questions or concerns about her previous treatment. 2. Survivorship. The patient is interested in meeting with survivorship clinic. I will place a referral for this.    Carola Rhine, PAC

## 2015-11-12 NOTE — Progress Notes (Signed)
Mrs. Kristy Singh is here for a one month follow up visit for right breast cancer.  Skin status: What lotion are you using? Have you seen med onc? If not, when is appointment? 08-12-15 Dr. Jana Hakim next appointment 12-09-15 If they are ER+, have they started Al or Tamoxifen? If not, why? Has not started antiestrogen Discuss survivorship appointment. Not scheduled Have you had a mammogram scheduled? No Offer referral to [facility]. Appetite: Pain: Fatigue: Arm mobility:

## 2015-11-13 ENCOUNTER — Telehealth: Payer: Self-pay | Admitting: *Deleted

## 2015-11-13 NOTE — Telephone Encounter (Signed)
CALLED PATIENT TO INFORM OF APPT. WITH GRETCHEN DAWSON ON 12-25-15 @ 11:30 AM, SPOKE WITH PATIENT'S HUSBAND (GARY) AND HE IS AWARE OF THIS APPT.

## 2015-12-03 ENCOUNTER — Ambulatory Visit (INDEPENDENT_AMBULATORY_CARE_PROVIDER_SITE_OTHER): Payer: BLUE CROSS/BLUE SHIELD | Admitting: Internal Medicine

## 2015-12-03 ENCOUNTER — Other Ambulatory Visit (INDEPENDENT_AMBULATORY_CARE_PROVIDER_SITE_OTHER): Payer: BLUE CROSS/BLUE SHIELD

## 2015-12-03 ENCOUNTER — Encounter: Payer: Self-pay | Admitting: Internal Medicine

## 2015-12-03 DIAGNOSIS — Z170 Estrogen receptor positive status [ER+]: Secondary | ICD-10-CM

## 2015-12-03 DIAGNOSIS — Z0000 Encounter for general adult medical examination without abnormal findings: Secondary | ICD-10-CM

## 2015-12-03 DIAGNOSIS — C50211 Malignant neoplasm of upper-inner quadrant of right female breast: Secondary | ICD-10-CM

## 2015-12-03 DIAGNOSIS — Z23 Encounter for immunization: Secondary | ICD-10-CM

## 2015-12-03 DIAGNOSIS — E785 Hyperlipidemia, unspecified: Secondary | ICD-10-CM | POA: Diagnosis not present

## 2015-12-03 LAB — CBC WITH DIFFERENTIAL/PLATELET
BASOS ABS: 0 10*3/uL (ref 0.0–0.1)
BASOS PCT: 0.9 % (ref 0.0–3.0)
EOS ABS: 0.2 10*3/uL (ref 0.0–0.7)
Eosinophils Relative: 4.5 % (ref 0.0–5.0)
HCT: 39.3 % (ref 36.0–46.0)
HEMOGLOBIN: 13.8 g/dL (ref 12.0–15.0)
Lymphocytes Relative: 40.1 % (ref 12.0–46.0)
Lymphs Abs: 1.5 10*3/uL (ref 0.7–4.0)
MCHC: 35.1 g/dL (ref 30.0–36.0)
MCV: 89 fl (ref 78.0–100.0)
MONO ABS: 0.3 10*3/uL (ref 0.1–1.0)
Monocytes Relative: 8.7 % (ref 3.0–12.0)
Neutro Abs: 1.7 10*3/uL (ref 1.4–7.7)
Neutrophils Relative %: 45.8 % (ref 43.0–77.0)
PLATELETS: 310 10*3/uL (ref 150.0–400.0)
RBC: 4.42 Mil/uL (ref 3.87–5.11)
RDW: 12.6 % (ref 11.5–15.5)
WBC: 3.8 10*3/uL — AB (ref 4.0–10.5)

## 2015-12-03 LAB — URINALYSIS, ROUTINE W REFLEX MICROSCOPIC
Bilirubin Urine: NEGATIVE
HGB URINE DIPSTICK: NEGATIVE
Ketones, ur: NEGATIVE
Nitrite: NEGATIVE
Specific Gravity, Urine: 1.025 (ref 1.000–1.030)
Total Protein, Urine: NEGATIVE
URINE GLUCOSE: NEGATIVE
UROBILINOGEN UA: 0.2 (ref 0.0–1.0)
pH: 6 (ref 5.0–8.0)

## 2015-12-03 LAB — BASIC METABOLIC PANEL
BUN: 16 mg/dL (ref 6–23)
CALCIUM: 9.4 mg/dL (ref 8.4–10.5)
CHLORIDE: 109 meq/L (ref 96–112)
CO2: 27 mEq/L (ref 19–32)
CREATININE: 0.78 mg/dL (ref 0.40–1.20)
GFR: 79.25 mL/min (ref >60.00)
Glucose, Bld: 91 mg/dL (ref 70–99)
Potassium: 3.5 mEq/L (ref 3.5–5.1)
Sodium: 143 mEq/L (ref 135–145)

## 2015-12-03 LAB — HEPATIC FUNCTION PANEL
ALK PHOS: 87 U/L (ref 39–117)
ALT: 14 U/L (ref 0–35)
AST: 14 U/L (ref 0–37)
Albumin: 4.2 g/dL (ref 3.5–5.2)
BILIRUBIN DIRECT: 0.1 mg/dL (ref 0.0–0.3)
BILIRUBIN TOTAL: 0.7 mg/dL (ref 0.2–1.2)
Total Protein: 6.7 g/dL (ref 6.0–8.3)

## 2015-12-03 LAB — LIPID PANEL
CHOL/HDL RATIO: 3
Cholesterol: 149 mg/dL (ref 0–200)
HDL: 52.3 mg/dL (ref >39.00)
LDL CALC: 78 mg/dL (ref 0–99)
NONHDL: 96.64
TRIGLYCERIDES: 95 mg/dL (ref 0.0–149.0)
VLDL: 19 mg/dL (ref 0.0–40.0)

## 2015-12-03 LAB — TSH: TSH: 2.65 u[IU]/mL (ref 0.35–4.50)

## 2015-12-03 MED ORDER — AMPICILLIN 500 MG PO CAPS
500.0000 mg | ORAL_CAPSULE | Freq: Four times a day (QID) | ORAL | 0 refills | Status: DC
Start: 2015-12-03 — End: 2015-12-06

## 2015-12-03 MED ORDER — ROSUVASTATIN CALCIUM 10 MG PO TABS: 10.0000 mg | ORAL_TABLET | Freq: Every day | ORAL | 3 refills | Status: DC

## 2015-12-03 NOTE — Assessment & Plan Note (Signed)
Dr Lindi Adie, Dr [name], Dr [name] S/p XRT, surgery

## 2015-12-03 NOTE — Assessment & Plan Note (Addendum)
We discussed age appropriate health related issues, including available/recomended screening tests and vaccinations. We discussed a need for adhering to healthy diet and exercise. Labs/EKG were reviewed/ordered. All questions were answered.  Colon due 2019 

## 2015-12-03 NOTE — Progress Notes (Signed)
Subjective:  Patient ID: Kristy Singh, female    DOB: 1950/07/24  Age: 65 y.o. MRN: [id number]  CC: Annual Exam   HPI Kristy Singh presents for a well exam  Outpatient Medications Prior to Visit  Medication Sig Dispense Refill  . aspirin EC 81 MG tablet Take 81 mg by mouth daily.    . Cholecalciferol (VITAMIN D3) 1000 UNITS tablet Take 1,000 Units by mouth daily.      . rosuvastatin (CRESTOR) 10 MG tablet TAKE 1 TABLET DAILY 90 tablet 1  . hyaluronate sodium (RADIAPLEXRX) GEL Apply 1 application topically 2 (two) times daily.    . non-metallic deodorant Jethro Poling) MISC Apply 1 application topically daily as needed.     No facility-administered medications prior to visit.     ROS Review of Systems  Constitutional: Negative.  Negative for activity change, appetite change, chills, diaphoresis, fatigue, fever and unexpected weight change.  HENT: Negative for congestion, ear pain, facial swelling, hearing loss, mouth sores, nosebleeds, postnasal drip, rhinorrhea, sinus pressure, sneezing, sore throat, tinnitus and trouble swallowing.   Eyes: Negative for pain, discharge, redness, itching and visual disturbance.  Respiratory: Negative for cough, chest tightness, shortness of breath, wheezing and stridor.   Cardiovascular: Negative for chest pain, palpitations and leg swelling.  Gastrointestinal: Negative for abdominal distention, anal bleeding, blood in stool, constipation, diarrhea, nausea and rectal pain.  Genitourinary: Negative for difficulty urinating, dysuria, flank pain, frequency, genital sores, hematuria, pelvic pain, urgency, vaginal bleeding and vaginal discharge.  Musculoskeletal: Negative for arthralgias, back pain, gait problem, joint swelling, neck pain and neck stiffness.  Skin: Negative.  Negative for rash.  Neurological: Negative for dizziness, tremors, seizures, syncope, speech difficulty, weakness, numbness and headaches.  Hematological: Negative for adenopathy. Does not  bruise/bleed easily.  Psychiatric/Behavioral: Negative for behavioral problems, decreased concentration, dysphoric mood, sleep disturbance and suicidal ideas. The patient is not nervous/anxious.     Objective:  BP 138/80   Pulse 66   Temp 98.2 F (36.8 C) (Oral)   Ht 5\' 5"  (1.651 m)   Wt 178 lb (80.7 kg)   SpO2 98%   BMI 29.62 kg/m   BP Readings from Last 3 Encounters:  12/03/15 138/80  11/12/15 (!) 117/53  10/09/15 109/61    Wt Readings from Last 3 Encounters:  12/03/15 178 lb (80.7 kg)  11/12/15 180 lb 14.4 oz (82.1 kg)  10/09/15 181 lb 12.8 oz (82.5 kg)    Physical Exam  Constitutional: She appears well-developed. No distress.  HENT:  Head: Normocephalic.  Right Ear: External ear normal.  Left Ear: External ear normal.  Nose: Nose normal.  Mouth/Throat: Oropharynx is clear and moist.  Eyes: Conjunctivae are normal. Pupils are equal, round, and reactive to light. Right eye exhibits no discharge. Left eye exhibits no discharge.  Neck: Normal range of motion. Neck supple. No JVD present. No tracheal deviation present. No thyromegaly present.  Cardiovascular: Normal rate, regular rhythm and normal heart sounds.   Pulmonary/Chest: No stridor. No respiratory distress. She has no wheezes.  Abdominal: Soft. Bowel sounds are normal. She exhibits no distension and no mass. There is no tenderness. There is no rebound and no guarding.  Musculoskeletal: She exhibits no edema or tenderness.  Lymphadenopathy:    She has no cervical adenopathy.  Neurological: She displays normal reflexes. No cranial nerve deficit. She exhibits normal muscle tone. Coordination normal.  Skin: No rash noted. No erythema.  Psychiatric: She has a normal mood and affect. Her behavior is normal. Judgment and  thought content normal.    Lab Results  Component Value Date   WBC 5.2 07/30/2015   HGB 14.1 07/30/2015   HCT 42.2 07/30/2015   PLT 308 07/30/2015   GLUCOSE 75 11/28/2013   CHOL 171 11/28/2013     TRIG 128.0 11/28/2013   HDL 52.00 11/28/2013   LDLDIRECT 189.4 08/29/2010   LDLCALC 93 11/28/2013   ALT 29 11/28/2013   AST 25 11/28/2013   NA 143 11/28/2013   K 4.8 11/28/2013   CL 108 11/28/2013   CREATININE 0.8 11/28/2013   BUN 15 11/28/2013   CO2 25 11/28/2013   TSH 2.47 11/28/2013    No results found.  Assessment & Plan:   There are no diagnoses linked to this encounter. I am having Kristy Singh maintain her cholecalciferol, rosuvastatin, aspirin EC, hyaluronate sodium, and non-metallic deodorant.  No orders of the defined types were placed in this encounter.    Follow-up: No Follow-up on file.  Walker Kehr, MD

## 2015-12-03 NOTE — Progress Notes (Signed)
Pre visit review using our clinic review tool, if applicable. No additional management support is needed unless otherwise documented below in the visit note. 

## 2015-12-03 NOTE — Addendum Note (Signed)
Addended by: Cresenciano Lick on: 12/03/2015 09:56 AM   Modules accepted: Orders

## 2015-12-04 LAB — HEPATITIS C ANTIBODY: HCV AB: NEGATIVE

## 2015-12-05 ENCOUNTER — Encounter: Payer: Self-pay | Admitting: Internal Medicine

## 2015-12-05 ENCOUNTER — Other Ambulatory Visit: Payer: Self-pay | Admitting: Internal Medicine

## 2015-12-06 MED ORDER — AMPICILLIN 500 MG PO CAPS: 500.0000 mg | ORAL_CAPSULE | Freq: Four times a day (QID) | ORAL | 0 refills | Status: DC

## 2015-12-09 ENCOUNTER — Encounter: Payer: Self-pay | Admitting: Hematology and Oncology

## 2015-12-09 ENCOUNTER — Ambulatory Visit (HOSPITAL_BASED_OUTPATIENT_CLINIC_OR_DEPARTMENT_OTHER): Payer: BLUE CROSS/BLUE SHIELD | Admitting: Hematology and Oncology

## 2015-12-09 DIAGNOSIS — Z170 Estrogen receptor positive status [ER+]: Secondary | ICD-10-CM

## 2015-12-09 DIAGNOSIS — C50211 Malignant neoplasm of upper-inner quadrant of right female breast: Secondary | ICD-10-CM

## 2015-12-09 MED ORDER — ANASTROZOLE 1 MG PO TABS: 1.0000 mg | ORAL_TABLET | Freq: Every day | ORAL | 3 refills | Status: DC

## 2015-12-09 NOTE — Assessment & Plan Note (Signed)
Right lumpectomy 08/02/2015: Invasive ductal carcinoma grade 2, 0.7 cm, margins negative, 0/3 sentinel nodes, ER 100%, PR 95%, HER-2 negative ratio 1.29, Ki-67 15%, T1 BN 0 stage IA Oncotype DX score 21, 13% ROR, did not require chemotherapy, intermediate risk Adjuvant radiation therapy from 09/16/2015 to 10/11/2015  Treatment plan: Adjuvant antiestrogen therapy with anastrozole 1 mg by mouth daily 5-10 years Anastrozole counseling:We discussed the risks and benefits of anti-estrogen therapy with aromatase inhibitors. These include but not limited to insomnia, hot flashes, mood changes, vaginal dryness, bone density loss, and weight gain. We strongly believe that the benefits far outweigh the risks. Patient understands these risks and consented to starting treatment. Planned treatment duration is 5-10 years. Return to clinic in 3 months for toxicity check and follow-up    

## 2015-12-09 NOTE — Progress Notes (Signed)
Patient Care Team: Cassandria Anger, MD as PCP - General [name], MD as Consulting Physician (Obstetrics and Gynecology) Nicholas Lose, MD as Consulting Physician (Hematology and Oncology) Autumn Messing III, MD as Consulting Physician (General Surgery) Kyung Rudd, MD as Consulting Physician (Radiation Oncology)  DIAGNOSIS:  Encounter Diagnosis  Name Primary?  . Malignant neoplasm of upper-inner quadrant of right breast in female, estrogen receptor positive ([place])     SUMMARY OF ONCOLOGIC HISTORY:   Breast cancer of upper-inner quadrant of right female breast ([place])   07/04/2015 Mammogram    Right breast mass 7 mm 12:30 position      07/05/2015 Initial Diagnosis    Right breast biopsy 12:30 position: Invasive ductal carcinoma grade 2, ER 100%, PR 95%, HER-2 negative ratio 1.29, K 67 15%, T1 BN 0 stage IA      08/02/2015 Surgery    Right lumpectomy: Invasive ductal carcinoma grade 2, 0.7 cm, margins negative, 0/3 sentinel nodes, ER 100%, PR 95%, HER-2 negative ratio 1.29, Ki-67 15%, T1 BN 0 stage IA Oncotype DX score 21, 13% ROR      09/16/2015 - 10/11/2015 Radiation Therapy    Adjuvant radiation therapy       CHIEF COMPLIANT: Follow-up after radiation therapy  INTERVAL HISTORY: Kristy Singh is a 65 year old with above-mentioned history of right breast cancer treated with lumpectomy and adjuvant radiation. She is here to discuss starting adjuvant antiestrogen therapy with. She has done extremely well from radiation denies any new pain or discomfort in the breast.  REVIEW OF SYSTEMS:   Constitutional: Denies fevers, chills or abnormal weight loss Eyes: Denies blurriness of vision Ears, nose, mouth, throat, and face: Denies mucositis or sore throat Respiratory: Denies cough, dyspnea or wheezes Cardiovascular: Denies palpitation, chest discomfort Gastrointestinal:  Denies nausea, heartburn or change in bowel habits Skin: Denies abnormal skin rashes Lymphatics: Denies new  lymphadenopathy or easy bruising Neurological:Denies numbness, tingling or new weaknesses Behavioral/Psych: Mood is stable, no new changes  Extremities: No lower extremity edema Breast:  denies any pain or lumps or nodules in either breasts All other systems were reviewed with the patient and are negative.  I have reviewed the past medical history, past surgical history, social history and family history with the patient and they are unchanged from previous note.  ALLERGIES:  has No Known Allergies.  MEDICATIONS:  Current Outpatient Prescriptions  Medication Sig Dispense Refill  . ampicillin (PRINCIPEN) 500 MG capsule Take 1 capsule (500 mg total) by mouth 4 (four) times daily. 20 capsule 0  . aspirin EC 81 MG tablet Take 81 mg by mouth daily.    . Cholecalciferol (VITAMIN D3) 1000 UNITS tablet Take 1,000 Units by mouth daily.      . hyaluronate sodium (RADIAPLEXRX) GEL Apply 1 application topically 2 (two) times daily.    . non-metallic deodorant Jethro Poling) MISC Apply 1 application topically daily as needed.    . rosuvastatin (CRESTOR) 10 MG tablet Take 1 tablet (10 mg total) by mouth daily. 90 tablet 3   No current facility-administered medications for this visit.     PHYSICAL EXAMINATION: ECOG PERFORMANCE STATUS: 0 - Asymptomatic  Vitals:   12/09/15 1521  BP: (!) 121/58  Pulse: (!) 56  Resp: 18  Temp: 97.8 F (36.6 C)   Filed Weights   12/09/15 1521  Weight: 178 lb 14.4 oz (81.1 kg)    GENERAL:alert, no distress and comfortable SKIN: skin color, texture, turgor are normal, no rashes or significant lesions EYES: normal,  Conjunctiva are pink and non-injected, sclera clear OROPHARYNX:no exudate, no erythema and lips, buccal mucosa, and tongue normal  NECK: supple, thyroid normal size, non-tender, without nodularity LYMPH:  no palpable lymphadenopathy in the cervical, axillary or inguinal LUNGS: clear to auscultation and percussion with normal breathing effort HEART: regular  rate & rhythm and no murmurs and no lower extremity edema ABDOMEN:abdomen soft, non-tender and normal bowel sounds MUSCULOSKELETAL:no cyanosis of digits and no clubbing  NEURO: alert & oriented x 3 with fluent speech, no focal motor/sensory deficits EXTREMITIES: No lower extremity edema  LABORATORY DATA:  I have reviewed the data as listed   Chemistry      Component Value Date/Time   NA 143 12/03/2015 0904   K 3.5 12/03/2015 0904   CL 109 12/03/2015 0904   CO2 27 12/03/2015 0904   BUN 16 12/03/2015 0904   CREATININE 0.78 12/03/2015 0904      Component Value Date/Time   CALCIUM 9.4 12/03/2015 0904   ALKPHOS 87 12/03/2015 0904   AST 14 12/03/2015 0904   ALT 14 12/03/2015 0904   BILITOT 0.7 12/03/2015 0904       Lab Results  Component Value Date   WBC 3.8 (L) 12/03/2015   HGB 13.8 12/03/2015   HCT 39.3 12/03/2015   MCV 89.0 12/03/2015   PLT 310.0 12/03/2015   NEUTROABS 1.7 12/03/2015     ASSESSMENT & PLAN:  Breast cancer of upper-inner quadrant of right female breast ([place]) Right lumpectomy 08/02/2015: Invasive ductal carcinoma grade 2, 0.7 cm, margins negative, 0/3 sentinel nodes, ER 100%, PR 95%, HER-2 negative ratio 1.29, Ki-67 15%, T1 BN 0 stage IA Oncotype DX score 21, 13% ROR, did not require chemotherapy, intermediate risk Adjuvant radiation therapy from 09/16/2015 to 10/11/2015  Treatment plan: Adjuvant antiestrogen therapy with anastrozole 1 mg by mouth daily 5-10 years Anastrozole counseling:We discussed the risks and benefits of anti-estrogen therapy with aromatase inhibitors. These include but not limited to insomnia, hot flashes, mood changes, vaginal dryness, bone density loss, and weight gain. We strongly believe that the benefits far outweigh the risks. Patient understands these risks and consented to starting treatment. Planned treatment duration is 5-10 years. Return to clinic in 3 months for toxicity check and follow-up  Return to clinic in 3 months  for toxicity check No orders of the defined types were placed in this encounter.  The patient has a good understanding of the overall plan. she agrees with it. she will call with any problems that may develop before the next visit here.   Rulon Eisenmenger, MD 12/09/15

## 2015-12-25 ENCOUNTER — Encounter: Payer: BLUE CROSS/BLUE SHIELD | Admitting: Adult Health

## 2016-01-13 ENCOUNTER — Telehealth: Payer: Self-pay | Admitting: *Deleted

## 2016-01-13 NOTE — Telephone Encounter (Signed)
Patient called and stated,"I need to cancel my appointment on 01/15/16 because I will be travelling out of town. My return number is [phone]."

## 2016-01-15 ENCOUNTER — Encounter: Payer: BLUE CROSS/BLUE SHIELD | Admitting: Adult Health

## 2016-03-09 ENCOUNTER — Telehealth: Payer: Self-pay | Admitting: Hematology and Oncology

## 2016-03-09 NOTE — Telephone Encounter (Signed)
Pt called to r/s 1/17 appt to 1/25 at 845 am

## 2016-03-11 ENCOUNTER — Ambulatory Visit: Payer: BLUE CROSS/BLUE SHIELD | Admitting: Hematology and Oncology

## 2016-03-19 ENCOUNTER — Encounter: Payer: Self-pay | Admitting: Hematology and Oncology

## 2016-03-19 ENCOUNTER — Ambulatory Visit (HOSPITAL_BASED_OUTPATIENT_CLINIC_OR_DEPARTMENT_OTHER): Payer: BLUE CROSS/BLUE SHIELD | Admitting: Hematology and Oncology

## 2016-03-19 DIAGNOSIS — Z170 Estrogen receptor positive status [ER+]: Secondary | ICD-10-CM | POA: Diagnosis not present

## 2016-03-19 DIAGNOSIS — N951 Menopausal and female climacteric states: Secondary | ICD-10-CM | POA: Diagnosis not present

## 2016-03-19 DIAGNOSIS — C50211 Malignant neoplasm of upper-inner quadrant of right female breast: Secondary | ICD-10-CM | POA: Diagnosis not present

## 2016-03-19 NOTE — Assessment & Plan Note (Signed)
Right lumpectomy 08/02/2015: Invasive ductal carcinoma grade 2, 0.7 cm, margins negative, 0/3 sentinel nodes, ER 100%, PR 95%, HER-2 negative ratio 1.29, Ki-67 15%, T1 BN 0 stage IA Oncotype DX score 21, 13% ROR, did not require chemotherapy, intermediate risk Adjuvant radiation therapy from 09/16/2015 to 10/11/2015  Treatment plan: Adjuvant antiestrogen therapy with anastrozole 1 mg by mouth daily 5-10 years started 12/09/2015 Anastrozole toxicities:  Return to clinic in 6 months for follow-up and breast exams

## 2016-03-19 NOTE — Progress Notes (Signed)
Patient Care Team: Cassandria Anger, MD as PCP - General [name], MD as Consulting Physician (Obstetrics and Gynecology) Nicholas Lose, MD as Consulting Physician (Hematology and Oncology) Autumn Messing III, MD as Consulting Physician (General Surgery) Kyung Rudd, MD as Consulting Physician (Radiation Oncology)  DIAGNOSIS:  Encounter Diagnosis  Name Primary?  . Malignant neoplasm of upper-inner quadrant of right breast in female, estrogen receptor positive ([name])     SUMMARY OF ONCOLOGIC HISTORY:   Breast cancer of upper-inner quadrant of right female breast ([name])   07/04/2015 Mammogram    Right breast mass 7 mm 12:30 position      07/05/2015 Initial Diagnosis    Right breast biopsy 12:30 position: Invasive ductal carcinoma grade 2, ER 100%, PR 95%, HER-2 negative ratio 1.29, K 67 15%, T1 BN 0 stage IA      08/02/2015 Surgery    Right lumpectomy [name]): Invasive ductal carcinoma grade 2, 0.7 cm, margins negative, 0/3 sentinel nodes, ER 100%, PR 95%, HER-2 negative ratio 1.17, Ki-67 15%, T1 BN 0 stage IA Oncotype DX score 21, 13% ROR      09/16/2015 - 10/11/2015 Radiation Therapy    Radiation [name]) to right breast: 42.5Gy in 17 fractions, right breast boost: 7.5 Gy in 3 fractions.        12/09/2015 -  Anti-estrogen oral therapy    Anastrozole 1 mg by mouth daily       CHIEF COMPLIANT: Follow-up on anastrozole  INTERVAL HISTORY: Kristy Singh is a 66 year old with above-mentioned history right breast cancer who underwent lumpectomy and radiation is currently on anastrozole therapy. She is tolerating it extremely well. She denies any myalgias. She does have mild hot flashes which are unchanged from before.  REVIEW OF SYSTEMS:   Constitutional: Denies fevers, chills or abnormal weight loss Eyes: Denies blurriness of vision Ears, nose, mouth, throat, and face: Denies mucositis or sore throat Respiratory: Denies cough, dyspnea or wheezes Cardiovascular: Denies  palpitation, chest discomfort Gastrointestinal:  Denies nausea, heartburn or change in bowel habits Skin: Denies abnormal skin rashes Lymphatics: Denies new lymphadenopathy or easy bruising Neurological:Denies numbness, tingling or new weaknesses Behavioral/Psych: Mood is stable, no new changes  Extremities: No lower extremity edema Breast:  denies any pain or lumps or nodules in either breasts All other systems were reviewed with the patient and are negative.  I have reviewed the past medical history, past surgical history, social history and family history with the patient and they are unchanged from previous note.  ALLERGIES:  has No Known Allergies.  MEDICATIONS:  Current Outpatient Prescriptions  Medication Sig Dispense Refill  . ampicillin (PRINCIPEN) 500 MG capsule Take 1 capsule (500 mg total) by mouth 4 (four) times daily. 20 capsule 0  . anastrozole (ARIMIDEX) 1 MG tablet Take 1 tablet (1 mg total) by mouth daily. 90 tablet 3  . aspirin EC 81 MG tablet Take 81 mg by mouth daily.    . Cholecalciferol (VITAMIN D3) 1000 UNITS tablet Take 1,000 Units by mouth daily.      . rosuvastatin (CRESTOR) 10 MG tablet Take 1 tablet (10 mg total) by mouth daily. 90 tablet 3   No current facility-administered medications for this visit.     PHYSICAL EXAMINATION: ECOG PERFORMANCE STATUS: 1 - Symptomatic but completely ambulatory  Vitals:   03/19/16 0914  BP: 119/70  Pulse: 62  Resp: 17  Temp: 98.3 F (36.8 C)   Filed Weights   03/19/16 0914  Weight: 179 lb 3.2 oz (81.3  kg)    GENERAL:alert, no distress and comfortable SKIN: skin color, texture, turgor are normal, no rashes or significant lesions EYES: normal, Conjunctiva are pink and non-injected, sclera clear OROPHARYNX:no exudate, no erythema and lips, buccal mucosa, and tongue normal  NECK: supple, thyroid normal size, non-tender, without nodularity LYMPH:  no palpable lymphadenopathy in the cervical, axillary or  inguinal LUNGS: clear to auscultation and percussion with normal breathing effort HEART: regular rate & rhythm and no murmurs and no lower extremity edema ABDOMEN:abdomen soft, non-tender and normal bowel sounds MUSCULOSKELETAL:no cyanosis of digits and no clubbing  NEURO: alert & oriented x 3 with fluent speech, no focal motor/sensory deficits EXTREMITIES: No lower extremity edema BREAST: No palpable masses or nodules in either right or left breasts. No palpable axillary supraclavicular or infraclavicular adenopathy no breast tenderness or nipple discharge. (exam performed in the presence of a chaperone)  LABORATORY DATA:  I have reviewed the data as listed   Chemistry      Component Value Date/Time   NA 143 12/03/2015 0904   K 3.5 12/03/2015 0904   CL 109 12/03/2015 0904   CO2 27 12/03/2015 0904   BUN 16 12/03/2015 0904   CREATININE 0.78 12/03/2015 0904      Component Value Date/Time   CALCIUM 9.4 12/03/2015 0904   ALKPHOS 87 12/03/2015 0904   AST 14 12/03/2015 0904   ALT 14 12/03/2015 0904   BILITOT 0.7 12/03/2015 0904       Lab Results  Component Value Date   WBC 3.8 (L) 12/03/2015   HGB 13.8 12/03/2015   HCT 39.3 12/03/2015   MCV 89.0 12/03/2015   PLT 310.0 12/03/2015   NEUTROABS 1.7 12/03/2015    ASSESSMENT & PLAN:  Breast cancer of upper-inner quadrant of right female breast ([name]) Right lumpectomy 08/02/2015: Invasive ductal carcinoma grade 2, 0.7 cm, margins negative, 0/3 sentinel nodes, ER 100%, PR 95%, HER-2 negative ratio 1.29, Ki-67 15%, T1 BN 0 stage IA Oncotype DX score 21, 13% ROR, did not require chemotherapy, intermediate risk Adjuvant radiation therapy from 09/16/2015 to 10/11/2015  Treatment plan: Adjuvant antiestrogen therapy with anastrozole 1 mg by mouth daily 5-10 years started 12/09/2015 Anastrozole toxicities: Denies myalgias. Patient has had chronic mild hot flashes which are unchanged I ordered a mammogram to be done at the breast center  in May 2018.  Return to clinic in 6 months for follow-up and breast exams    I spent 25 minutes talking to the patient of which more than half was spent in counseling and coordination of care.  No orders of the defined types were placed in this encounter.  The patient has a good understanding of the overall plan. she agrees with it. she will call with any problems that may develop before the next visit here.   Rulon Eisenmenger, MD 03/19/16

## 2016-06-24 ENCOUNTER — Ambulatory Visit
Admission: RE | Admit: 2016-06-24 | Discharge: 2016-06-24 | Disposition: A | Discharge status: Home / Self Care | Payer: BLUE CROSS/BLUE SHIELD | Source: Ambulatory Visit | Attending: Hematology and Oncology | Admitting: Hematology and Oncology

## 2016-06-24 DIAGNOSIS — C50211 Malignant neoplasm of upper-inner quadrant of right female breast: Secondary | ICD-10-CM

## 2016-06-24 DIAGNOSIS — Z170 Estrogen receptor positive status [ER+]: Principal | ICD-10-CM

## 2016-09-15 NOTE — Assessment & Plan Note (Signed)
Right lumpectomy 08/02/2015: Invasive ductal carcinoma grade 2, 0.7 cm, margins negative, 0/3 sentinel nodes, ER 100%, PR 95%, HER-2 negative ratio 1.29, Ki-67 15%, T1 BN 0 stage IA Oncotype DX score 21, 13% ROR, did not require chemotherapy, intermediate risk Adjuvant radiation therapy from 09/16/2015 to 10/11/2015  Treatment plan: Adjuvant antiestrogen therapy with anastrozole 1 mg by mouth daily 5-7 years started 12/09/2015  Anastrozole toxicities: Denies myalgias. Patient has had chronic mild hot flashes which are unchanged I ordered a mammogram to be done at the breast center in May 2018.  Return to clinic in 1 year for follow-up and breast exams

## 2016-09-16 ENCOUNTER — Encounter: Payer: Self-pay | Admitting: Hematology and Oncology

## 2016-09-16 ENCOUNTER — Ambulatory Visit (HOSPITAL_BASED_OUTPATIENT_CLINIC_OR_DEPARTMENT_OTHER): Payer: BLUE CROSS/BLUE SHIELD | Admitting: Hematology and Oncology

## 2016-09-16 DIAGNOSIS — C50211 Malignant neoplasm of upper-inner quadrant of right female breast: Secondary | ICD-10-CM | POA: Diagnosis not present

## 2016-09-16 DIAGNOSIS — Z170 Estrogen receptor positive status [ER+]: Secondary | ICD-10-CM | POA: Diagnosis not present

## 2016-09-16 DIAGNOSIS — N951 Menopausal and female climacteric states: Secondary | ICD-10-CM

## 2016-09-16 MED ORDER — ANASTROZOLE 1 MG PO TABS: 1.0000 mg | ORAL_TABLET | Freq: Every day | ORAL | 3 refills | Status: DC

## 2016-09-16 MED ORDER — TURMERIC 500 MG PO CAPS: 500.0000 mg | ORAL_CAPSULE | Freq: Every day | ORAL | Status: AC

## 2016-09-16 NOTE — Progress Notes (Signed)
Patient Care Team: Plotnikov, Evie Lacks, MD as PCP - General Kristy Millers, MD as Consulting Physician (Obstetrics and Gynecology) Kristy Lose, MD as Consulting Physician (Hematology and Oncology) Kristy Kussmaul, MD as Consulting Physician (General Surgery) Kristy Rudd, MD as Consulting Physician (Radiation Oncology)  DIAGNOSIS:  Encounter Diagnosis  Name Primary?  . Malignant neoplasm of upper-inner quadrant of right breast in female, estrogen receptor positive ([place])     SUMMARY OF ONCOLOGIC HISTORY:   Breast cancer of upper-inner quadrant of right female breast ([place])   07/04/2015 Mammogram    Right breast mass 7 mm 12:30 position      07/05/2015 Initial Diagnosis    Right breast biopsy 12:30 position: Invasive ductal carcinoma grade 2, ER 100%, PR 95%, HER-2 negative ratio 1.29, K 67 15%, T1 BN 0 stage IA      08/02/2015 Surgery    Right lumpectomy [name]): Invasive ductal carcinoma grade 2, 0.7 cm, margins negative, 0/3 sentinel nodes, ER 100%, PR 95%, HER-2 negative ratio 1.17, Ki-67 15%, T1 BN 0 stage IA Oncotype DX score 21, 13% ROR      09/16/2015 - 10/11/2015 Radiation Therapy    Radiation [name]) to right breast: 42.5Gy in 17 fractions, right breast boost: 7.5 Gy in 3 fractions.        12/09/2015 -  Anti-estrogen oral therapy    Anastrozole 1 mg by mouth daily       CHIEF COMPLIANT: Follow-up on anastrozole therapy  INTERVAL HISTORY: Kristy Singh is a 66 year old with above-mentioned history of right breast cancer treated with lumpectomy and radiation and is currently on anastrozole. She is tolerating anastrozole extremely well. She reports mild hot flashes but they're not bothering her. She denies any arthralgias or myalgias. She is now taking turmeric for the hip discomfort which has improved. Her job requires sitting in a chair for 8-10 hours every day. She is trying to use a standing desk.  REVIEW OF SYSTEMS:   Constitutional: Denies fevers, chills or  abnormal weight loss Eyes: Denies blurriness of vision Ears, nose, mouth, throat, and face: Denies mucositis or sore throat Respiratory: Denies cough, dyspnea or wheezes Cardiovascular: Denies palpitation, chest discomfort Gastrointestinal:  Denies nausea, heartburn or change in bowel habits Skin: Denies abnormal skin rashes Lymphatics: Denies new lymphadenopathy or easy bruising Neurological:Denies numbness, tingling or new weaknesses Behavioral/Psych: Mood is stable, no new changes  Extremities: No lower extremity edema Breast:  denies any pain or lumps or nodules in either breasts All other systems were reviewed with the patient and are negative.  I have reviewed the past medical history, past surgical history, social history and family history with the patient and they are unchanged from previous note.  ALLERGIES:  has No Known Allergies.  MEDICATIONS:  Current Outpatient Prescriptions  Medication Sig Dispense Refill  . anastrozole (ARIMIDEX) 1 MG tablet Take 1 tablet (1 mg total) by mouth daily. 90 tablet 3  . aspirin EC 81 MG tablet Take 81 mg by mouth daily.    . Cholecalciferol (VITAMIN D3) 1000 UNITS tablet Take 1,000 Units by mouth daily.      . rosuvastatin (CRESTOR) 10 MG tablet Take 1 tablet (10 mg total) by mouth daily. 90 tablet 3  . Turmeric 500 MG CAPS Take 500 mg by mouth daily.     No current facility-administered medications for this visit.     PHYSICAL EXAMINATION: ECOG PERFORMANCE STATUS: 0 - Asymptomatic  Vitals:   09/16/16 0808  BP: 131/67  Pulse: 62  Resp: 20  Temp: 98.3 F (36.8 C)   Filed Weights   09/16/16 0808  Weight: 184 lb 3.2 oz (83.6 kg)    GENERAL:alert, no distress and comfortable SKIN: skin color, texture, turgor are normal, no rashes or significant lesions EYES: normal, Conjunctiva are pink and non-injected, sclera clear OROPHARYNX:no exudate, no erythema and lips, buccal mucosa, and tongue normal  NECK: supple, thyroid normal  size, non-tender, without nodularity LYMPH:  no palpable lymphadenopathy in the cervical, axillary or inguinal LUNGS: clear to auscultation and percussion with normal breathing effort HEART: regular rate & rhythm and no murmurs and no lower extremity edema ABDOMEN:abdomen soft, non-tender and normal bowel sounds MUSCULOSKELETAL:no cyanosis of digits and no clubbing  NEURO: alert & oriented x 3 with fluent speech, no focal motor/sensory deficits EXTREMITIES: No lower extremity edema BREAST: No palpable masses or nodules in either right or left breasts. No palpable axillary supraclavicular or infraclavicular adenopathy no breast tenderness or nipple discharge. (exam performed in the presence of a chaperone)  LABORATORY DATA:  I have reviewed the data as listed   Chemistry      Component Value Date/Time   NA 143 12/03/2015 0904   K 3.5 12/03/2015 0904   CL 109 12/03/2015 0904   CO2 27 12/03/2015 0904   BUN 16 12/03/2015 0904   CREATININE 0.78 12/03/2015 0904      Component Value Date/Time   CALCIUM 9.4 12/03/2015 0904   ALKPHOS 87 12/03/2015 0904   AST 14 12/03/2015 0904   ALT 14 12/03/2015 0904   BILITOT 0.7 12/03/2015 0904       Lab Results  Component Value Date   WBC 3.8 (L) 12/03/2015   HGB 13.8 12/03/2015   HCT 39.3 12/03/2015   MCV 89.0 12/03/2015   PLT 310.0 12/03/2015   NEUTROABS 1.7 12/03/2015    ASSESSMENT & PLAN:  Breast cancer of upper-inner quadrant of right female breast ([place]) Right lumpectomy 08/02/2015: Invasive ductal carcinoma grade 2, 0.7 cm, margins negative, 0/3 sentinel nodes, ER 100%, PR 95%, HER-2 negative ratio 1.29, Ki-67 15%, T1 BN 0 stage IA Oncotype DX score 21, 13% ROR, did not require chemotherapy, intermediate risk Adjuvant radiation therapy from 09/16/2015 to 10/11/2015  Treatment plan: Adjuvant antiestrogen therapy with anastrozole 1 mg by mouth daily 5-7 years started 12/09/2015  Anastrozole toxicities: Denies myalgias. Patient has  had chronic mild hot flashes which are unchanged Breast cancer surveillance:  mammogram May 2018: Benign breast density category B  Return to clinic in 1 year for follow-up and breast exams  I spent 15 minutes talking to the patient of which more than half was spent in counseling and coordination of care.  No orders of the defined types were placed in this encounter.  The patient has a good understanding of the overall plan. she agrees with it. she will call with any problems that may develop before the next visit here.   Rulon Eisenmenger, MD 09/16/16

## 2016-12-04 ENCOUNTER — Other Ambulatory Visit (INDEPENDENT_AMBULATORY_CARE_PROVIDER_SITE_OTHER): Payer: BLUE CROSS/BLUE SHIELD

## 2016-12-04 ENCOUNTER — Encounter: Payer: Self-pay | Admitting: Internal Medicine

## 2016-12-04 ENCOUNTER — Ambulatory Visit (INDEPENDENT_AMBULATORY_CARE_PROVIDER_SITE_OTHER): Payer: BLUE CROSS/BLUE SHIELD | Admitting: Internal Medicine

## 2016-12-04 VITALS — BP 128/72 | HR 64 | Temp 97.4°F | Ht 65.0 in | Wt 185.0 lb

## 2016-12-04 DIAGNOSIS — Z1211 Encounter for screening for malignant neoplasm of colon: Secondary | ICD-10-CM

## 2016-12-04 DIAGNOSIS — Z0000 Encounter for general adult medical examination without abnormal findings: Secondary | ICD-10-CM | POA: Diagnosis not present

## 2016-12-04 LAB — URINALYSIS, ROUTINE W REFLEX MICROSCOPIC
Bilirubin Urine: NEGATIVE
Hgb urine dipstick: NEGATIVE
Ketones, ur: NEGATIVE
Nitrite: NEGATIVE
PH: 6.5 (ref 5.0–8.0)
RBC / HPF: NONE SEEN (ref 0–?)
SPECIFIC GRAVITY, URINE: 1.01 (ref 1.000–1.030)
TOTAL PROTEIN, URINE-UPE24: NEGATIVE
URINE GLUCOSE: NEGATIVE
UROBILINOGEN UA: 0.2 (ref 0.0–1.0)

## 2016-12-04 LAB — CBC WITH DIFFERENTIAL/PLATELET
BASOS ABS: 0.1 10*3/uL (ref 0.0–0.1)
BASOS PCT: 1.5 % (ref 0.0–3.0)
EOS ABS: 0.1 10*3/uL (ref 0.0–0.7)
Eosinophils Relative: 2.2 % (ref 0.0–5.0)
HEMATOCRIT: 44.3 % (ref 36.0–46.0)
HEMOGLOBIN: 14.9 g/dL (ref 12.0–15.0)
LYMPHS PCT: 38 % (ref 12.0–46.0)
Lymphs Abs: 1.7 10*3/uL (ref 0.7–4.0)
MCHC: 33.7 g/dL (ref 30.0–36.0)
MCV: 92.5 fl (ref 78.0–100.0)
MONO ABS: 0.4 10*3/uL (ref 0.1–1.0)
Monocytes Relative: 8.4 % (ref 3.0–12.0)
NEUTROS ABS: 2.2 10*3/uL (ref 1.4–7.7)
Neutrophils Relative %: 49.9 % (ref 43.0–77.0)
PLATELETS: 331 10*3/uL (ref 150.0–400.0)
RBC: 4.78 Mil/uL (ref 3.87–5.11)
RDW: 12.9 % (ref 11.5–15.5)
WBC: 4.3 10*3/uL (ref 4.0–10.5)

## 2016-12-04 LAB — HEPATIC FUNCTION PANEL
ALBUMIN: 4.7 g/dL (ref 3.5–5.2)
ALT: 16 U/L (ref 0–35)
AST: 18 U/L (ref 0–37)
Alkaline Phosphatase: 90 U/L (ref 39–117)
Bilirubin, Direct: 0.1 mg/dL (ref 0.0–0.3)
TOTAL PROTEIN: 7.1 g/dL (ref 6.0–8.3)
Total Bilirubin: 0.6 mg/dL (ref 0.2–1.2)

## 2016-12-04 LAB — BASIC METABOLIC PANEL
BUN: 18 mg/dL (ref 6–23)
CHLORIDE: 109 meq/L (ref 96–112)
CO2: 20 mEq/L (ref 19–32)
CREATININE: 0.74 mg/dL (ref 0.40–1.20)
Calcium: 9.6 mg/dL (ref 8.4–10.5)
GFR: 83.94 mL/min (ref >60.00)
GLUCOSE: 98 mg/dL (ref 70–99)
POTASSIUM: 4.6 meq/L (ref 3.5–5.1)
Sodium: 143 mEq/L (ref 135–145)

## 2016-12-04 LAB — LIPID PANEL
CHOLESTEROL: 179 mg/dL (ref 0–200)
HDL: 58.8 mg/dL (ref >39.00)
LDL Cholesterol: 95 mg/dL (ref 0–99)
NonHDL: 119.71
TRIGLYCERIDES: 125 mg/dL (ref 0.0–149.0)
Total CHOL/HDL Ratio: 3
VLDL: 25 mg/dL (ref 0.0–40.0)

## 2016-12-04 LAB — TSH: TSH: 3.96 u[IU]/mL (ref 0.35–4.50)

## 2016-12-04 MED ORDER — ROSUVASTATIN CALCIUM 10 MG PO TABS: 10.0000 mg | ORAL_TABLET | Freq: Every day | ORAL | 3 refills | Status: DC

## 2016-12-04 NOTE — Progress Notes (Signed)
Subjective:  Patient ID: Kristy Singh, female    DOB: 1951/02/22  Age: 66 y.o. MRN: [id number]  CC: No chief complaint on file.   HPI Kristy Singh presents for a well exam  Outpatient Medications Prior to Visit  Medication Sig Dispense Refill  . anastrozole (ARIMIDEX) 1 MG tablet Take 1 tablet (1 mg total) by mouth daily. 90 tablet 3  . aspirin EC 81 MG tablet Take 81 mg by mouth daily.    . Cholecalciferol (VITAMIN D3) 1000 UNITS tablet Take 1,000 Units by mouth daily.      . rosuvastatin (CRESTOR) 10 MG tablet Take 1 tablet (10 mg total) by mouth daily. 90 tablet 3  . Turmeric 500 MG CAPS Take 500 mg by mouth daily.     No facility-administered medications prior to visit.     ROS Review of Systems  Constitutional: Negative for activity change, appetite change, chills, fatigue and unexpected weight change.  HENT: Negative for congestion, mouth sores and sinus pressure.   Eyes: Negative for visual disturbance.  Respiratory: Negative for cough and chest tightness.   Gastrointestinal: Negative for abdominal pain and nausea.  Genitourinary: Negative for difficulty urinating, frequency and vaginal pain.  Musculoskeletal: Negative for back pain and gait problem.  Skin: Negative for pallor and rash.  Neurological: Negative for dizziness, tremors, weakness, numbness and headaches.  Psychiatric/Behavioral: Negative for confusion and sleep disturbance.    Objective:  BP 128/72 (BP Location: Left Arm, Patient Position: Sitting, Cuff Size: Normal)   Pulse 64   Temp (!) 97.4 F (36.3 C) (Oral)   Ht 5\' 5"  (1.651 m)   Wt 185 lb (83.9 kg)   SpO2 99%   BMI 30.79 kg/m   BP Readings from Last 3 Encounters:  12/04/16 128/72  09/16/16 131/67  03/19/16 119/70    Wt Readings from Last 3 Encounters:  12/04/16 185 lb (83.9 kg)  09/16/16 184 lb 3.2 oz (83.6 kg)  03/19/16 179 lb 3.2 oz (81.3 kg)    Physical Exam  Constitutional: She appears well-developed. No distress.  HENT:    Head: Normocephalic.  Right Ear: External ear normal.  Left Ear: External ear normal.  Nose: Nose normal.  Mouth/Throat: Oropharynx is clear and moist.  Eyes: Pupils are equal, round, and reactive to light. Conjunctivae are normal. Right eye exhibits no discharge. Left eye exhibits no discharge.  Neck: Normal range of motion. Neck supple. No JVD present. No tracheal deviation present. No thyromegaly present.  Cardiovascular: Normal rate, regular rhythm and normal heart sounds.   Pulmonary/Chest: No stridor. No respiratory distress. She has no wheezes.  Abdominal: Soft. Bowel sounds are normal. She exhibits no distension and no mass. There is no tenderness. There is no rebound and no guarding.  Musculoskeletal: She exhibits no edema or tenderness.  Lymphadenopathy:    She has no cervical adenopathy.  Neurological: She displays normal reflexes. No cranial nerve deficit. She exhibits normal muscle tone. Coordination normal.  Skin: No rash noted. No erythema.  Psychiatric: She has a normal mood and affect. Her behavior is normal. Judgment and thought content normal.    Procedure: EKG Indication:well exam Impression: NSR. No acute changes.   Lab Results  Component Value Date   WBC 3.8 (L) 12/03/2015   HGB 13.8 12/03/2015   HCT 39.3 12/03/2015   PLT 310.0 12/03/2015   GLUCOSE 91 12/03/2015   CHOL 149 12/03/2015   TRIG 95.0 12/03/2015   HDL 52.30 12/03/2015   LDLDIRECT 189.4 08/29/2010   [place]  78 12/03/2015   ALT 14 12/03/2015   AST 14 12/03/2015   NA 143 12/03/2015   K 3.5 12/03/2015   CL 109 12/03/2015   CREATININE 0.78 12/03/2015   BUN 16 12/03/2015   CO2 27 12/03/2015   TSH 2.65 12/03/2015    Mm Diag Breast Tomo Bilateral  Result Date: 06/24/2016 CLINICAL DATA:  Status post right lumpectomy and radiation therapy for breast cancer in 2017. EXAM: 2D DIGITAL DIAGNOSTIC BILATERAL MAMMOGRAM WITH CAD AND ADJUNCT TOMO COMPARISON:  Previous exam(s). ACR Breast Density Category  b: There are scattered areas of fibroglandular density. FINDINGS: Interval right post lumpectomy and postradiation changes with no findings suspicious for malignancy in either breast. Mammographic images were processed with CAD. IMPRESSION: No evidence of malignancy. RECOMMENDATION: Bilateral diagnostic mammogram in 1 year. I have discussed the findings and recommendations with the patient. Results were also provided in writing at the conclusion of the visit. If applicable, a reminder letter will be sent to the patient regarding the next appointment. BI-RADS CATEGORY  2: Benign. Electronically Signed   By: Claudie Revering M.D.   On: 06/24/2016 09:12    Assessment & Plan:   There are no diagnoses linked to this encounter. I am having Ms. Nogales maintain her cholecalciferol, aspirin EC, rosuvastatin, Turmeric, and anastrozole.  No orders of the defined types were placed in this encounter.    Follow-up: No Follow-up on file.  Walker Kehr, MD

## 2016-12-04 NOTE — Assessment & Plan Note (Addendum)
We discussed age appropriate health related issues, including available/recomended screening tests and vaccinations. We discussed a need for adhering to healthy diet and exercise. Labs were ordered to be later reviewed . All questions were answered. EKG Colon due in 2019

## 2016-12-06 ENCOUNTER — Other Ambulatory Visit: Payer: Self-pay | Admitting: Hematology and Oncology

## 2017-02-09 ENCOUNTER — Encounter: Payer: Self-pay | Admitting: Internal Medicine

## 2017-03-17 ENCOUNTER — Telehealth: Payer: Self-pay | Admitting: *Deleted

## 2017-03-17 NOTE — Telephone Encounter (Signed)
Patient requested medical records from Dr.Foalks. Medical record request was sent via-fax.

## 2017-03-24 ENCOUNTER — Telehealth: Payer: Self-pay | Admitting: Internal Medicine

## 2017-03-24 NOTE — Telephone Encounter (Signed)
Received previous GI records and placed on Dr. Celesta Aver desk for review.  Patient stated that Dr. Alain Marion is recommending Dr. Carlean Purl

## 2017-03-26 ENCOUNTER — Encounter: Payer: Self-pay | Admitting: Internal Medicine

## 2017-03-26 NOTE — Telephone Encounter (Signed)
Dr.Gessner reviewed records and accepted patient for direct colon. Left message on vm for patient to cb and schedule. Records in referral folder for now.

## 2017-05-26 ENCOUNTER — Other Ambulatory Visit: Payer: Self-pay | Admitting: Hematology and Oncology

## 2017-05-26 DIAGNOSIS — Z853 Personal history of malignant neoplasm of breast: Secondary | ICD-10-CM

## 2017-05-27 ENCOUNTER — Other Ambulatory Visit: Payer: Self-pay

## 2017-05-27 ENCOUNTER — Ambulatory Visit (AMBULATORY_SURGERY_CENTER): Payer: Self-pay | Admitting: *Deleted

## 2017-05-27 VITALS — Ht 65.0 in | Wt 186.0 lb

## 2017-05-27 DIAGNOSIS — Z860100 Personal history of colon polyps, unspecified: Secondary | ICD-10-CM

## 2017-05-27 DIAGNOSIS — Z800 Family history of malignant neoplasm of digestive organs: Secondary | ICD-10-CM

## 2017-05-27 NOTE — Progress Notes (Signed)
No egg or soy allergy known to patient  No issues with past sedation with any surgeries  or procedures, no intubation problems  No diet pills per patient No home 02 use per patient  No blood thinners per patient  Pt denies issues with constipation  No A fib or A flutter  EMMI video sent to pt's e mail  

## 2017-05-28 ENCOUNTER — Encounter: Payer: Self-pay | Admitting: Internal Medicine

## 2017-06-03 ENCOUNTER — Encounter: Payer: Self-pay | Admitting: Internal Medicine

## 2017-06-03 ENCOUNTER — Ambulatory Visit (INDEPENDENT_AMBULATORY_CARE_PROVIDER_SITE_OTHER): Payer: 59 | Admitting: Internal Medicine

## 2017-06-03 ENCOUNTER — Ambulatory Visit (INDEPENDENT_AMBULATORY_CARE_PROVIDER_SITE_OTHER)
Admission: RE | Admit: 2017-06-03 | Discharge: 2017-06-03 | Disposition: A | Discharge status: Home / Self Care | Payer: 59 | Source: Ambulatory Visit | Attending: Internal Medicine | Admitting: Internal Medicine

## 2017-06-03 VITALS — BP 126/74 | HR 83 | Temp 100.5°F | Ht 65.0 in | Wt 187.0 lb

## 2017-06-03 DIAGNOSIS — J069 Acute upper respiratory infection, unspecified: Secondary | ICD-10-CM

## 2017-06-03 DIAGNOSIS — J101 Influenza due to other identified influenza virus with other respiratory manifestations: Secondary | ICD-10-CM | POA: Diagnosis not present

## 2017-06-03 DIAGNOSIS — R6889 Other general symptoms and signs: Secondary | ICD-10-CM

## 2017-06-03 HISTORY — DX: Influenza due to other identified influenza virus with other respiratory manifestations: J10.1

## 2017-06-03 LAB — POC INFLUENZA A&B (BINAX/QUICKVUE)
INFLUENZA A, POC: POSITIVE — AB
INFLUENZA B, POC: NEGATIVE

## 2017-06-03 MED ORDER — PROMETHAZINE-CODEINE 6.25-10 MG/5ML PO SYRP: 5.0000 mL | ORAL_SOLUTION | ORAL | 0 refills | Status: DC | PRN

## 2017-06-03 MED ORDER — OSELTAMIVIR PHOSPHATE 75 MG PO CAPS: 75.0000 mg | ORAL_CAPSULE | Freq: Two times a day (BID) | ORAL | 0 refills | Status: DC

## 2017-06-03 MED ORDER — AZITHROMYCIN 250 MG PO TABS: ORAL_TABLET | ORAL | 0 refills | Status: DC

## 2017-06-03 NOTE — Assessment & Plan Note (Addendum)
CXR Zpac if worse Prom-cod

## 2017-06-03 NOTE — Patient Instructions (Signed)
You can use over-the-counter  "cold" medicines  such as "Tylenol cold" , "Advil cold",  "Mucinex" or" Mucinex D"  for cough and congestion.   Avoid decongestants if you have high blood pressure and use "Afrin" nasal spray for nasal congestion as directed. Use " Delsym" or" Robitussin" cough syrup varietis for cough.  You can use plain "Tylenol" or "Advil" for fever, chills and achyness. Use Halls or Ricola cough drops.   Please, make an appointment if you are not better or if you're worse.  

## 2017-06-03 NOTE — Progress Notes (Signed)
Subjective:  Patient ID: Kristy Singh, female    DOB: [birth year]  Age: 67 y.o. MRN: [id number]  CC: No chief complaint on file.   HPI Kristy Singh presents for severe URI sx's x 1 week - worse. OTC meds don't help. Bad cough - can't sleep. Chills, achy...  Outpatient Medications Prior to Visit  Medication Sig Dispense Refill  . anastrozole (ARIMIDEX) 1 MG tablet TAKE 1 TABLET DAILY 90 tablet 3  . aspirin EC 81 MG tablet Take 81 mg by mouth daily.    . bisacodyl (DULCOLAX) 5 MG EC tablet Take 5 mg by mouth once. For colon 4-18 x 4    . Cholecalciferol (VITAMIN D3) 1000 UNITS tablet Take 1,000 Units by mouth daily.      [name] Calcium (STOOL SOFTENER PO) Take 1 tablet by mouth every other day. Rexall stool softener    . polyethylene glycol powder (MIRALAX) powder Take 1 Container by mouth once. 238 grams for colon 4-18    . rosuvastatin (CRESTOR) 10 MG tablet Take 1 tablet (10 mg total) by mouth daily. 90 tablet 3  . Turmeric 500 MG CAPS Take 500 mg by mouth daily.     No facility-administered medications prior to visit.     ROS Review of Systems  Constitutional: Positive for chills, fatigue and fever. Negative for activity change, appetite change and unexpected weight change.  HENT: Positive for postnasal drip, rhinorrhea and sore throat. Negative for congestion, mouth sores and sinus pressure.   Eyes: Negative for visual disturbance.  Respiratory: Positive for cough and chest tightness.   Gastrointestinal: Negative for abdominal pain and nausea.  Genitourinary: Negative for difficulty urinating, frequency and vaginal pain.  Musculoskeletal: Negative for back pain and gait problem.  Skin: Negative for pallor and rash.  Neurological: Negative for dizziness, tremors, weakness, numbness and headaches.  Psychiatric/Behavioral: Negative for confusion and sleep disturbance.    Objective:  BP 126/74 (BP Location: Left Arm, Patient Position: Sitting, Cuff Size: Large)   Pulse 83    Temp (!) 100.5 F (38.1 C) (Oral)   Ht 5\' 5"  (1.651 m)   Wt 187 lb (84.8 kg)   SpO2 96%   BMI 31.12 kg/m   BP Readings from Last 3 Encounters:  06/03/17 126/74  12/04/16 128/72  09/16/16 131/67    Wt Readings from Last 3 Encounters:  06/03/17 187 lb (84.8 kg)  05/27/17 186 lb (84.4 kg)  12/04/16 185 lb (83.9 kg)    Physical Exam  Constitutional: She appears well-developed. No distress.  HENT:  Head: Normocephalic.  Right Ear: External ear normal.  Left Ear: External ear normal.  Nose: Nose normal.  Eyes: Pupils are equal, round, and reactive to light. Conjunctivae are normal. Right eye exhibits no discharge. Left eye exhibits no discharge.  Neck: Normal range of motion. Neck supple. No JVD present. No tracheal deviation present. No thyromegaly present.  Cardiovascular: Normal rate, regular rhythm and normal heart sounds.  Pulmonary/Chest: No stridor. No respiratory distress. She has no wheezes.  Abdominal: Soft. Bowel sounds are normal. She exhibits no distension and no mass. There is no tenderness. There is no rebound and no guarding.  Musculoskeletal: She exhibits no edema or tenderness.  Lymphadenopathy:    She has no cervical adenopathy.  Neurological: She displays normal reflexes. No cranial nerve deficit. She exhibits normal muscle tone. Coordination normal.  Skin: No rash noted. No erythema.  Psychiatric: She has a normal mood and affect. Her behavior is normal. Judgment and thought  content normal.  eryth throat  Flu type A(+)  Lab Results  Component Value Date   WBC 4.3 12/04/2016   HGB 14.9 12/04/2016   HCT 44.3 12/04/2016   PLT 331.0 12/04/2016   GLUCOSE 98 12/04/2016   CHOL 179 12/04/2016   TRIG 125.0 12/04/2016   HDL 58.80 12/04/2016   LDLDIRECT 189.4 08/29/2010   LDLCALC 95 12/04/2016   ALT 16 12/04/2016   AST 18 12/04/2016   NA 143 12/04/2016   K 4.6 12/04/2016   CL 109 12/04/2016   CREATININE 0.74 12/04/2016   BUN 18 12/04/2016   CO2 20  12/04/2016   TSH 3.96 12/04/2016    Mm Diag Breast Tomo Bilateral  Result Date: 06/24/2016 CLINICAL DATA:  Status post right lumpectomy and radiation therapy for breast cancer in 2017. EXAM: 2D DIGITAL DIAGNOSTIC BILATERAL MAMMOGRAM WITH CAD AND ADJUNCT TOMO COMPARISON:  Previous exam(s). ACR Breast Density Category b: There are scattered areas of fibroglandular density. FINDINGS: Interval right post lumpectomy and postradiation changes with no findings suspicious for malignancy in either breast. Mammographic images were processed with CAD. IMPRESSION: No evidence of malignancy. RECOMMENDATION: Bilateral diagnostic mammogram in 1 year. I have discussed the findings and recommendations with the patient. Results were also provided in writing at the conclusion of the visit. If applicable, a reminder letter will be sent to the patient regarding the next appointment. BI-RADS CATEGORY  2: Benign. Electronically Signed   By: Claudie Revering M.D.   On: 06/24/2016 09:12    Assessment & Plan:   Diagnoses and all orders for this visit:  Flu-like symptoms -     POC Influenza A&B (Binax test)   I am having Neale Burly maintain her cholecalciferol, aspirin EC, Turmeric, rosuvastatin, anastrozole, Docusate Calcium (STOOL SOFTENER PO), bisacodyl, and polyethylene glycol powder.  No orders of the defined types were placed in this encounter.    Follow-up: No follow-ups on file.  Walker Kehr, MD

## 2017-06-03 NOTE — Assessment & Plan Note (Signed)
Tamiflu Rx for pt and her husband

## 2017-06-10 ENCOUNTER — Encounter: Payer: Self-pay | Admitting: Internal Medicine

## 2017-06-10 ENCOUNTER — Ambulatory Visit (AMBULATORY_SURGERY_CENTER): Payer: 59 | Admitting: Internal Medicine

## 2017-06-10 ENCOUNTER — Other Ambulatory Visit: Payer: Self-pay

## 2017-06-10 VITALS — BP 146/76 | HR 72 | Temp 97.8°F | Resp 14 | Ht 65.0 in | Wt 186.0 lb

## 2017-06-10 DIAGNOSIS — D124 Benign neoplasm of descending colon: Secondary | ICD-10-CM

## 2017-06-10 DIAGNOSIS — K635 Polyp of colon: Secondary | ICD-10-CM

## 2017-06-10 DIAGNOSIS — D122 Benign neoplasm of ascending colon: Secondary | ICD-10-CM

## 2017-06-10 DIAGNOSIS — Z86010 Personal history of colonic polyps: Secondary | ICD-10-CM | POA: Diagnosis present

## 2017-06-10 DIAGNOSIS — D125 Benign neoplasm of sigmoid colon: Secondary | ICD-10-CM

## 2017-06-10 DIAGNOSIS — D123 Benign neoplasm of transverse colon: Secondary | ICD-10-CM

## 2017-06-10 MED ORDER — SODIUM CHLORIDE 0.9 % IV SOLN: 500.0000 mL | Freq: Once | INTRAVENOUS | Status: DC

## 2017-06-10 NOTE — Progress Notes (Signed)
Called to room to assist during endoscopic procedure.  Patient ID and intended procedure confirmed with present staff. Received instructions for my participation in the procedure from the performing physician.  

## 2017-06-10 NOTE — Op Note (Signed)
[facility] Patient Name: Kristy Singh Procedure Date: 06/10/2017 10:18 AM MRN: [id number] Endoscopist: Gatha Mayer , MD Age: 67 Referring MD:  Date of Birth: 03-06-1950 Gender: Female Account #: [id number] Procedure:                Colonoscopy Indications:              Surveillance: Personal history of adenomatous                            polyps on last colonoscopy > 5 years ago, Last                            colonoscopy: 2009 Medicines:                Propofol per Anesthesia, Monitored Anesthesia Care Procedure:                Pre-Anesthesia Assessment:                           - Prior to the procedure, a History and Physical                            was performed, and patient medications and                            allergies were reviewed. The patient's tolerance of                            previous anesthesia was also reviewed. The risks                            and benefits of the procedure and the sedation                            options and risks were discussed with the patient.                            All questions were answered, and informed consent                            was obtained. Prior Anticoagulants: The patient has                            taken no previous anticoagulant or antiplatelet                            agents. ASA Grade Assessment: II - A patient with                            mild systemic disease. After reviewing the risks                            and benefits, the patient was deemed in  satisfactory condition to undergo the procedure.                           After obtaining informed consent, the colonoscope                            was passed under direct vision. Throughout the                            procedure, the patient's blood pressure, pulse, and                            oxygen saturations were monitored continuously. The                            Model CF-HQ190L  [id number]) scope was introduced                            through the anus and advanced to the the cecum,                            identified by appendiceal orifice and ileocecal                            valve. The colonoscopy was performed without                            difficulty. The patient tolerated the procedure                            well. The quality of the bowel preparation was                            good. The bowel preparation used was Miralax. The                            ileocecal valve, appendiceal orifice, and rectum                            were photographed. Scope In: 10:32:37 AM Scope Out: 10:58:13 AM Scope Withdrawal Time: 0 hours 22 minutes 30 seconds  Total Procedure Duration: 0 hours 25 minutes 36 seconds  Findings:                 Skin tags were found on perianal exam.                           The digital rectal exam was normal.                           A 15 mm polyp was found in the transverse colon.                            The polyp was sessile. The polyp was removed with a  hot snare. Resection and retrieval were complete.                            Verification of patient identification for the                            specimen was done. Estimated blood loss: none. To                            prevent bleeding after the polypectomy, two                            hemostatic clips were successfully placed (MR                            conditional). There was no bleeding during, or at                            the end, of the procedure.                           Six sessile polyps were found in the sigmoid colon,                            descending colon, transverse colon and ascending                            colon. The polyps were diminutive in size. These                            polyps were removed with a cold snare. Resection                            and retrieval were complete. Verification of                             patient identification for the specimen was done.                            Estimated blood loss was minimal.                           Internal hemorrhoids were found during retroflexion.                           The exam was otherwise without abnormality on                            direct and retroflexion views. Complications:            No immediate complications. Estimated Blood Loss:     Estimated blood loss was minimal. Impression:               - Perianal skin tags found on perianal exam.                           -  One 15 mm polyp in the transverse colon, removed                            with a hot snare. Resected and retrieved. Clips (MR                            conditional) were placed.                           - Six diminutive polyps in the sigmoid colon, in                            the descending colon, in the transverse colon and                            in the ascending colon, removed with a cold snare.                            Resected and retrieved.                           - Internal hemorrhoids.                           - The examination was otherwise normal on direct                            and retroflexion views.                           we will make her an office appointment to evauate                            and likely band hemorrhoids Recommendation:           - Patient has a contact number available for                            emergencies. The signs and symptoms of potential                            delayed complications were discussed with the                            patient. Return to normal activities tomorrow.                            Written discharge instructions were provided to the                            patient.                           - Resume previous diet.                           -  Continue present medications.                           - No aspirin, ibuprofen, naproxen, or other                             non-steroidal anti-inflammatory drugs for 2 weeks                            after polyp removal.                           - Repeat colonoscopy is recommended for                            surveillance. The colonoscopy date will be                            determined after pathology results from today's                            exam become available for review. Gatha Mayer, MD 06/10/2017 11:09:12 AM This report has been signed electronically.

## 2017-06-10 NOTE — Progress Notes (Signed)
Pt's states no medical or surgical changes since previsit or office visit except pt had Flu diagnosed 06/02/17

## 2017-06-10 NOTE — Patient Instructions (Addendum)
I found and removed 7 polyps. 6 tiny and one medium sized. 2 clips applied to reduce bleeding risks (medium polyp)  I will let you know pathology results and when to have another routine colonoscopy by mail and/or My Chart.  You also have hemorrhoids. Please read the banding pamphlet and I will have my staff contact you to make a hemorrhoid treatment appointment.  I appreciate the opportunity to care for you. Gatha Mayer, MD, FACG   NO NSAIDS until Jun 24, 2017  (Aspirin, Ibuprofen, Naproxen, or other non-steroidal anti-inflammatory drugs for 2 weeks)  Polyp handout provided  Hemorrhoid handout provided Hemorrhoid banding pamphlet provided  Clip care provided   YOU HAD AN ENDOSCOPIC PROCEDURE TODAY AT [facility]:   Refer to the procedure report that was given to you for any specific questions about what was found during the examination.  If the procedure report does not answer your questions, please call your gastroenterologist to clarify.  If you requested that your care partner not be given the details of your procedure findings, then the procedure report has been included in a sealed envelope for you to review at your convenience later.  YOU SHOULD EXPECT: Some feelings of bloating in the abdomen. Passage of more gas than usual.  Walking can help get rid of the air that was put into your GI tract during the procedure and reduce the bloating. If you had a lower endoscopy (such as a colonoscopy or flexible sigmoidoscopy) you may notice spotting of blood in your stool or on the toilet paper. If you underwent a bowel prep for your procedure, you may not have a normal bowel movement for a few days.  Please Note:  You might notice some irritation and congestion in your nose or some drainage.  This is from the oxygen used during your procedure.  There is no need for concern and it should clear up in a day or so.  SYMPTOMS TO REPORT IMMEDIATELY:   Following  lower endoscopy (colonoscopy or flexible sigmoidoscopy):  Excessive amounts of blood in the stool  Significant tenderness or worsening of abdominal pains  Swelling of the abdomen that is new, acute  Fever of 100F or higher    For urgent or emergent issues, a gastroenterologist can be reached at any hour by calling [phone].   DIET:  We do recommend a small meal at first, but then you may proceed to your regular diet.  Drink plenty of fluids but you should avoid alcoholic beverages for 24 hours.  ACTIVITY:  You should plan to take it easy for the rest of today and you should NOT DRIVE or use heavy machinery until tomorrow (because of the sedation medicines used during the test).    FOLLOW UP: Our staff will call the number listed on your records the next business day following your procedure to check on you and address any questions or concerns that you may have regarding the information given to you following your procedure. If we do not reach you, we will leave a message.  However, if you are feeling well and you are not experiencing any problems, there is no need to return our call.  We will assume that you have returned to your regular daily activities without incident.  If any biopsies were taken you will be contacted by phone or by letter within the next 1-3 weeks.  Please call us at [phone] if you have not heard about the biopsies  in 3 weeks.    SIGNATURES/CONFIDENTIALITY: You and/or your care partner have signed paperwork which will be entered into your electronic medical record.  These signatures attest to the fact that that the information above on your After Visit Summary has been reviewed and is understood.  Full responsibility of the confidentiality of this discharge information lies with you and/or your care-partner.

## 2017-06-10 NOTE — Progress Notes (Signed)
approx 1046 bpt noted to be wretching.  At first clear drool looking fluid suctioned from oropharnyx.  It did eventually turn to bile colored fluid approx 100cc suctioned

## 2017-06-10 NOTE — Progress Notes (Signed)
Report to PACU, RN, vss, BBS= Clear.  

## 2017-06-14 ENCOUNTER — Telehealth: Payer: Self-pay | Admitting: *Deleted

## 2017-06-14 NOTE — Telephone Encounter (Signed)
  Follow up Call-  Call back number 06/10/2017  Post procedure Call Back phone  # -[phone]  Permission to leave phone message Yes  Some recent data might be hidden     Patient questions:  Do you have a fever, pain , or abdominal swelling? No. Pain Score  0 *  Have you tolerated food without any problems? Yes.    Have you been able to return to your normal activities? Yes.    Do you have any questions about your discharge instructions: Diet   No. Medications  No. Follow up visit  No.  Do you have questions or concerns about your Care? No.  Actions: * If pain score is 4 or above: No action needed, pain <4.

## 2017-06-17 ENCOUNTER — Encounter: Payer: Self-pay | Admitting: Internal Medicine

## 2017-06-17 NOTE — Progress Notes (Signed)
7 adenomas max 15 mm Recall 3 years 2022 My Chart letter  Office - please contact patient also and get her hemorrhoid banding appointment

## 2017-06-25 ENCOUNTER — Ambulatory Visit
Admission: RE | Admit: 2017-06-25 | Discharge: 2017-06-25 | Disposition: A | Discharge status: Home / Self Care | Payer: 59 | Source: Ambulatory Visit | Attending: Hematology and Oncology | Admitting: Hematology and Oncology

## 2017-06-25 DIAGNOSIS — Z853 Personal history of malignant neoplasm of breast: Secondary | ICD-10-CM

## 2017-06-25 HISTORY — DX: Malignant neoplasm of unspecified site of unspecified female breast: C50.919

## 2017-06-25 HISTORY — DX: Personal history of irradiation: Z92.3

## 2017-07-21 ENCOUNTER — Ambulatory Visit (INDEPENDENT_AMBULATORY_CARE_PROVIDER_SITE_OTHER): Payer: 59 | Admitting: Internal Medicine

## 2017-07-21 ENCOUNTER — Encounter: Payer: Self-pay | Admitting: Internal Medicine

## 2017-07-21 DIAGNOSIS — K642 Third degree hemorrhoids: Secondary | ICD-10-CM

## 2017-07-21 HISTORY — DX: Third degree hemorrhoids: K64.2

## 2017-07-21 NOTE — Patient Instructions (Addendum)
HEMORRHOID BANDING PROCEDURE    FOLLOW-UP CARE   1. The procedure you have had should have been relatively painless since the banding of the area involved does not have nerve endings and there is no pain sensation.  The rubber band cuts off the blood supply to the hemorrhoid and the band may fall off as soon as 48 hours after the banding (the band may occasionally be seen in the toilet bowl following a bowel movement). You may notice a temporary feeling of fullness in the rectum which should respond adequately to plain Tylenol or Motrin.  2. Following the banding, avoid strenuous exercise that evening and resume full activity the next day.  A sitz bath (soaking in a warm tub) or bidet is soothing, and can be useful for cleansing the area after bowel movements.     3. To avoid constipation, take two tablespoons of natural wheat bran, natural oat bran, flax, Benefiber or any over the counter fiber supplement and increase your water intake to 7-8 glasses daily.    4. Unless you have been prescribed anorectal medication, do not put anything inside your rectum for two weeks: No suppositories, enemas, fingers, etc.  5. Occasionally, you may have more bleeding than usual after the banding procedure.  This is often from the untreated hemorrhoids rather than the treated one.  Don't be concerned if there is a tablespoon or so of blood.  If there is more blood than this, lie flat with your bottom higher than your head and apply an ice pack to the area. If the bleeding does not stop within a half an hour or if you feel faint, call our office at [phone] or go to the emergency room.  6. Problems are not common; however, if there is a substantial amount of bleeding, severe pain, chills, fever or difficulty passing urine (very rare) or other problems, you should call us at [phone] or report to the nearest emergency room.  7. Do not stay seated continuously for more than 2-3 hours for a day or two  after the procedure.  Tighten your buttock muscles 10-15 times every two hours and take 10-15 deep breaths every 1-2 hours.  Do not spend more than a few minutes on the toilet if you cannot empty your bowel; instead re-visit the toilet at a later time.    Stay on your stool softener.  Please follow up with Dr Carlean Purl on 08/11/17 at 3:00pm for more banding.   I appreciate the opportunity to care for you. Silvano Rusk, MD, [facility]

## 2017-07-21 NOTE — Assessment & Plan Note (Signed)
Right anterior grade 3 hemorrhoid banded return to clinic 2 weeks continue stool softener

## 2017-07-21 NOTE — Progress Notes (Signed)
   Hemorrhoidal ligation:  Colonoscopy April 2019 7 adenomatous polyps and hemorrhoids seen  Symptoms are prolapse  Moving bowels better on stool softener relates prior history of banding 30 years ago  Rectal exam with Caryl Pina LPN present  Anoderm shows anal tag soft and fleshy with slight bulge of hemorrhoid from the right anterior normal digital exam with soft stool normal resting tone nontender  Anoscopic exam demonstrates grade 3 right anterior prolapsing hemorrhoid and grade 2 left lateral and right posterior hemorrhoids  PROCEDURE NOTE: The patient presents with symptomatic grade 3 hemorrhoids, requesting rubber band ligation of his/her hemorrhoidal disease.  All risks, benefits and alternative forms of therapy were described and informed consent was obtained.   The anorectum was pre-medicated with 0.125% nitroglycerin and 5% topical lidocaine The decision was made to band the right anterior internal hemorrhoid, and the [place] was used to perform band ligation without complication.  Digital anorectal examination was then performed to assure proper positioning of the band, and to adjust the banded tissue as required.  The patient was discharged home without pain or other issues.  Dietary and behavioral recommendations were given and along with follow-up instructions.     The following adjunctive treatments were recommended:  Continue stool softener  The patient will return in 2 weeks for  follow-up and possible additional banding as required. No complications were encountered and the patient tolerated the procedure well.  I appreciate the opportunity to care for this patient. CC: Plotnikov, Evie Lacks, MD

## 2017-07-27 ENCOUNTER — Telehealth: Payer: Self-pay | Admitting: Hematology and Oncology

## 2017-07-27 NOTE — Telephone Encounter (Signed)
Spoke w/ pt.  ([facility] PAL) Rescheduled pt from 7/25 to 8/1 @ 8:30 am.

## 2017-08-11 ENCOUNTER — Ambulatory Visit (INDEPENDENT_AMBULATORY_CARE_PROVIDER_SITE_OTHER): Payer: 59 | Admitting: Internal Medicine

## 2017-08-11 ENCOUNTER — Encounter: Payer: Self-pay | Admitting: Internal Medicine

## 2017-08-11 DIAGNOSIS — K642 Third degree hemorrhoids: Secondary | ICD-10-CM | POA: Diagnosis not present

## 2017-08-11 NOTE — Patient Instructions (Signed)
If you are age 65 or older, your body mass index should be between 23-30. Your Body mass index is 30.85 kg/m. If this is out of the aforementioned range listed, please consider follow up with your Primary Care Provider.  If you are age 9 or younger, your body mass index should be between 19-25. Your Body mass index is 30.85 kg/m. If this is out of the aformentioned range listed, please consider follow up with your Primary Care Provider.   HEMORRHOID BANDING PROCEDURE    FOLLOW-UP CARE   1. The procedure you have had should have been relatively painless since the banding of the area involved does not have nerve endings and there is no pain sensation.  The rubber band cuts off the blood supply to the hemorrhoid and the band may fall off as soon as 48 hours after the banding (the band may occasionally be seen in the toilet bowl following a bowel movement). You may notice a temporary feeling of fullness in the rectum which should respond adequately to plain Tylenol or Motrin.  2. Following the banding, avoid strenuous exercise that evening and resume full activity the next day.  A sitz bath (soaking in a warm tub) or bidet is soothing, and can be useful for cleansing the area after bowel movements.     3. To avoid constipation, take two tablespoons of natural wheat bran, natural oat bran, flax, Benefiber or any over the counter fiber supplement and increase your water intake to 7-8 glasses daily.    4. Unless you have been prescribed anorectal medication, do not put anything inside your rectum for two weeks: No suppositories, enemas, fingers, etc.  5. Occasionally, you may have more bleeding than usual after the banding procedure.  This is often from the untreated hemorrhoids rather than the treated one.  Don't be concerned if there is a tablespoon or so of blood.  If there is more blood than this, lie flat with your bottom higher than your head and apply an ice pack to the area. If the bleeding  does not stop within a half an hour or if you feel faint, call our office at [phone] or go to the emergency room.  6. Problems are not common; however, if there is a substantial amount of bleeding, severe pain, chills, fever or difficulty passing urine (very rare) or other problems, you should call us at [phone] or report to the nearest emergency room.  7. Do not stay seated continuously for more than 2-3 hours for a day or two after the procedure.  Tighten your buttock muscles 10-15 times every two hours and take 10-15 deep breaths every 1-2 hours.  Do not spend more than a few minutes on the toilet if you cannot empty your bowel; instead re-visit the toilet at a later time.   Follow up as needed with Dr. Carlean Purl.  Thank you.

## 2017-08-11 NOTE — Progress Notes (Signed)
  HEMORRHOID LIGATION  SXS: Prolapse, s/p banding of RA Gr 3 internal hemorrhoid 07/21/17 - improved  LAST COLONOSCOPY:05/2017  Pricilla Riffle, LPN present  RECTAL: normal  PROCEDURE NOTE: The patient presents with symptomatic grade 2 hemorrhoids, requesting rubber band ligation of his/her hemorrhoidal disease.  All risks, benefits and alternative forms of therapy were described and informed consent was obtained.   The anorectum was pre-medicated with 0.125% NTG and 5% Lidocaine topical The decision was made to band the LL and RP internal hemorrhoids, and the [place] was used to perform band ligation without complication.  Digital anorectal examination was then performed to assure proper positioning of the band, and to adjust the banded tissue as required.  The patient was discharged home without pain or other issues.  Dietary and behavioral recommendations were given and along with follow-up instructions.     The following adjunctive treatments were recommended:  Continue stool softener  The patient will return as neededfor  follow-up and possible additional banding as required. No complications were encountered and the patient tolerated the procedure well.   I appreciate the opportunity to care for this patient.  RZ:NBVAPOLID, Evie Lacks, MD

## 2017-08-11 NOTE — Assessment & Plan Note (Addendum)
RP and LL banded RTC prn

## 2017-09-16 ENCOUNTER — Ambulatory Visit: Payer: BLUE CROSS/BLUE SHIELD | Admitting: Hematology and Oncology

## 2017-09-21 ENCOUNTER — Telehealth: Payer: Self-pay | Admitting: Hematology and Oncology

## 2017-09-21 NOTE — Telephone Encounter (Signed)
Patient left voice mail.  Patient needed to reschedule 8/1 appt.  Made appt for 8/13.

## 2017-09-23 ENCOUNTER — Ambulatory Visit: Payer: 59 | Admitting: Hematology and Oncology

## 2017-10-05 ENCOUNTER — Telehealth: Payer: Self-pay | Admitting: Hematology and Oncology

## 2017-10-05 ENCOUNTER — Inpatient Hospital Stay: Payer: 59 | Attending: Hematology and Oncology | Admitting: Hematology and Oncology

## 2017-10-05 VITALS — BP 122/71 | HR 63 | Temp 98.2°F | Resp 17 | Ht 65.0 in | Wt 186.9 lb

## 2017-10-05 DIAGNOSIS — Z170 Estrogen receptor positive status [ER+]: Secondary | ICD-10-CM | POA: Insufficient documentation

## 2017-10-05 DIAGNOSIS — Z79811 Long term (current) use of aromatase inhibitors: Secondary | ICD-10-CM | POA: Insufficient documentation

## 2017-10-05 DIAGNOSIS — R232 Flushing: Secondary | ICD-10-CM | POA: Diagnosis not present

## 2017-10-05 DIAGNOSIS — C50211 Malignant neoplasm of upper-inner quadrant of right female breast: Secondary | ICD-10-CM | POA: Diagnosis present

## 2017-10-05 DIAGNOSIS — Z923 Personal history of irradiation: Secondary | ICD-10-CM | POA: Diagnosis not present

## 2017-10-05 DIAGNOSIS — Z79899 Other long term (current) drug therapy: Secondary | ICD-10-CM | POA: Diagnosis not present

## 2017-10-05 DIAGNOSIS — Z780 Asymptomatic menopausal state: Secondary | ICD-10-CM

## 2017-10-05 IMAGING — US US BREAST 10 MINUTES
1 series · 8 of 8 positions shown · non-contrast
Comparison: Previous exam(s).

CLINICAL DATA: Screening recall for a possible right breast mass.

EXAM:
2D DIGITAL DIAGNOSTIC RIGHT MAMMOGRAM WITH CAD AND ADJUNCT TOMO
ULTRASOUND RIGHT BREAST

[Series 1: us breast 10 minutes · 0.06mm/px · 8 of 8 slices shown]
[im 1/8]
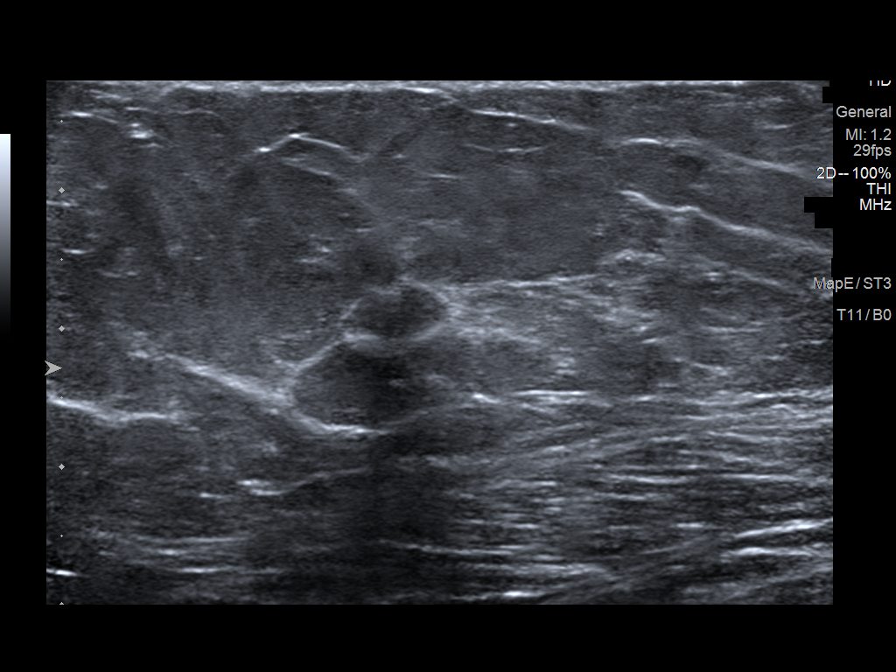
[im 2/8]
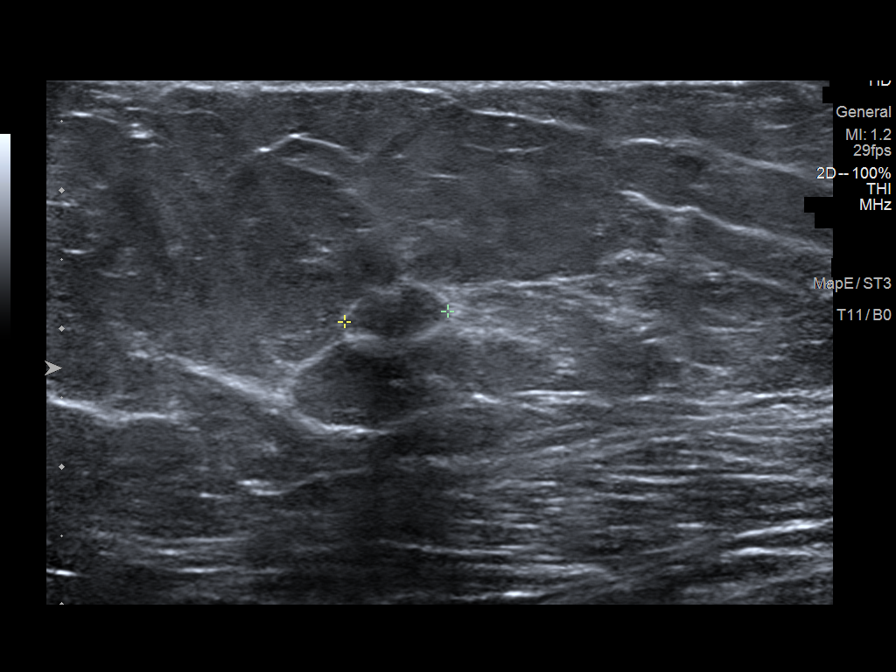
[im 3/8]
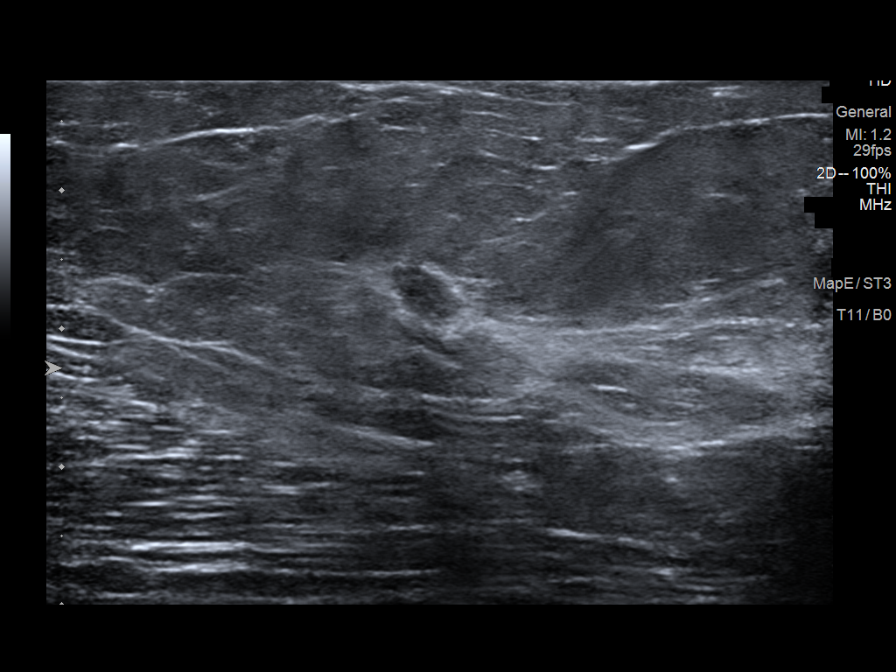
[im 4/8]
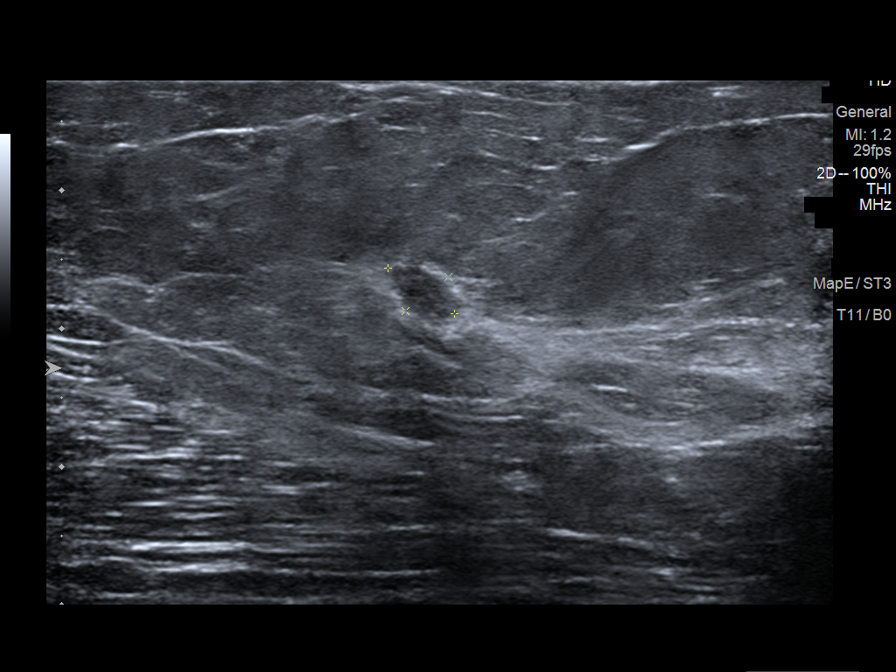
[im 5/8]
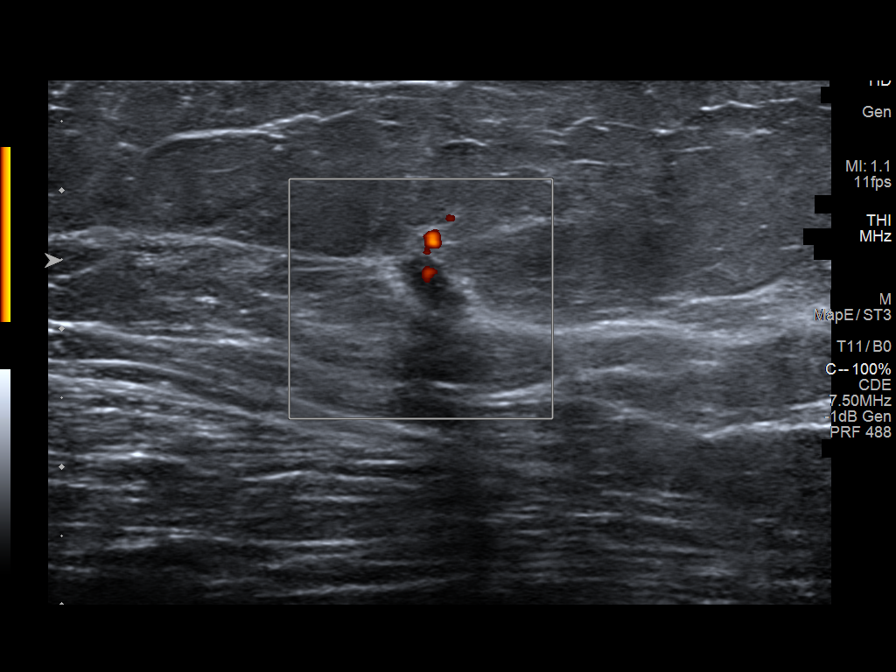
[im 6/8]
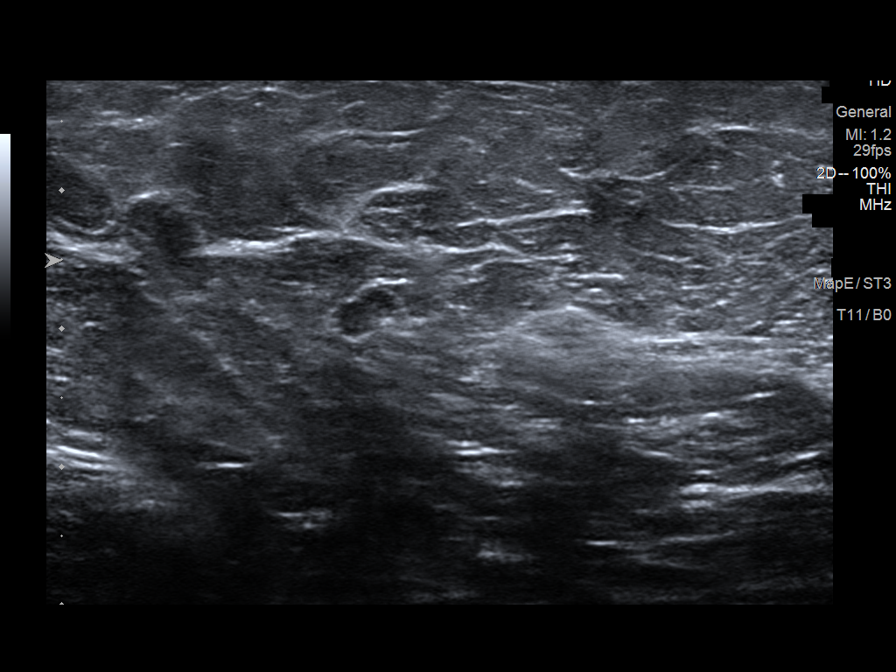
[im 7/8]
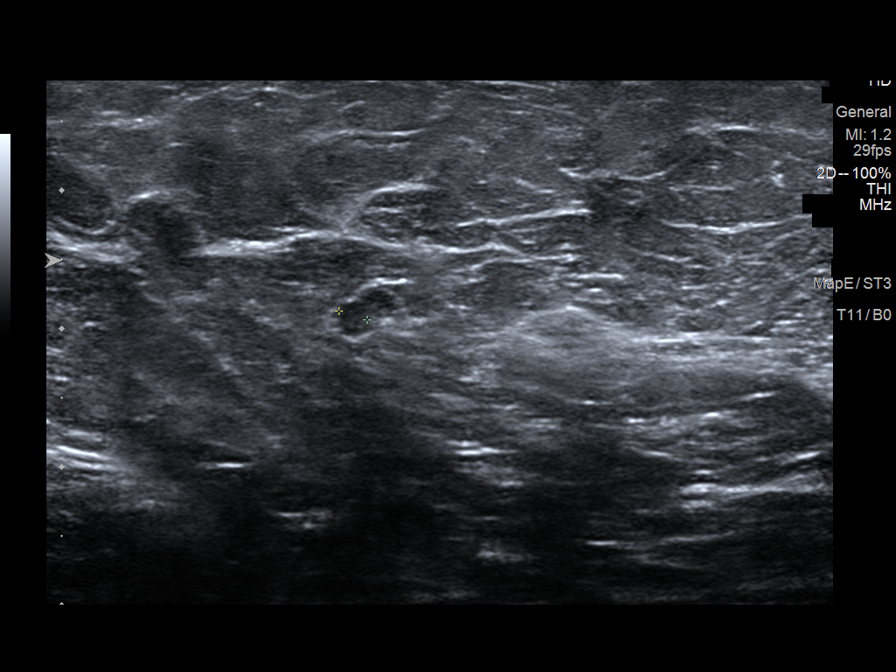
[im 8/8]
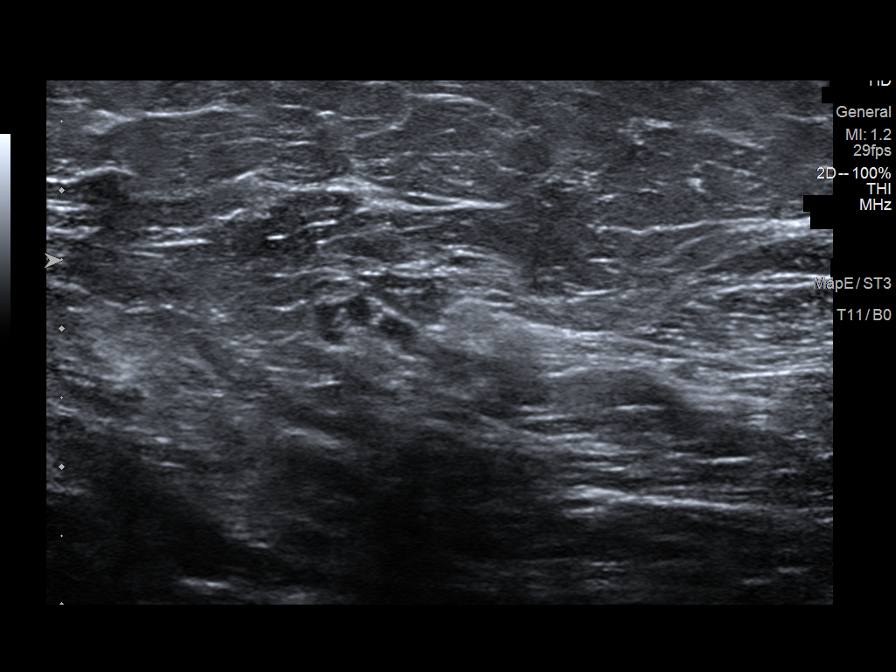

[8 of 8 positions shown; findings below may reference images not displayed]

ACR Breast Density Category b: There are scattered areas of
fibroglandular density.
FINDINGS: On spot compression imaging, the possible mass in the superior right
breast persists. It is oval with irregular margins. Small
calcification is noted along its anterior margin.

There are no other masses.  There is no architectural distortion.

Mammographic images were processed with CAD.

On physical exam, no mass is palpated in the upper right breast.

Targeted ultrasound is performed, showing a small hypoechoic mass in
12:30 o'clock position of the right breast 5 cm the nipple,
measuring 7 x 4 x 6 mm. It is oval in shape with mildly irregular
and partly ill-defined margins. It cyst demonstrates internal blood
flow on color Doppler analysis. Evaluation of the right axilla shows
no enlarged or abnormal lymph nodes.
IMPRESSION: 1. 7 mm mass in the 12:30 o'clock position of the right breast
suspicious for a breast malignancy. Biopsy is indicated.

RECOMMENDATION:
Ultrasound-guided core needle biopsy.

I have discussed the findings and recommendations with the patient.
Results were also provided in writing at the conclusion of the
visit. If applicable, a reminder letter will be sent to the patient
regarding the next appointment.

BI-RADS CATEGORY  5: Highly suggestive of malignancy.

## 2017-10-05 IMAGING — MG MM DIAG BREAST TOMO UNI RIGHT
4 series · 4 of 8 positions shown · non-contrast
Comparison: Previous exam(s).

CLINICAL DATA: Screening recall for a possible right breast mass.

EXAM:
2D DIGITAL DIAGNOSTIC RIGHT MAMMOGRAM WITH CAD AND ADJUNCT TOMO
ULTRASOUND RIGHT BREAST

[R CC (1 of 2)]
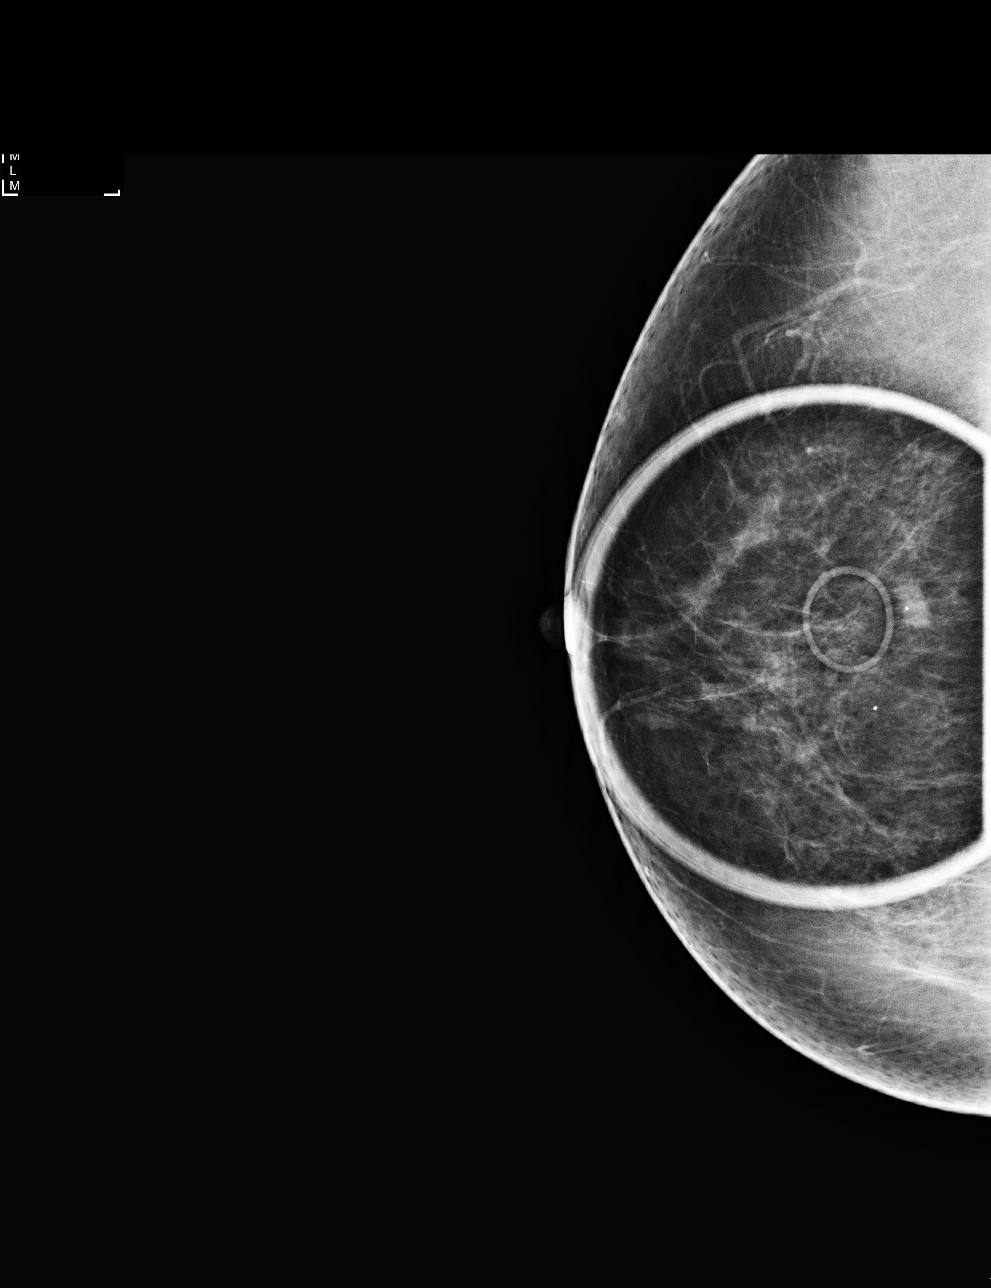

[R CC (2 of 2)]
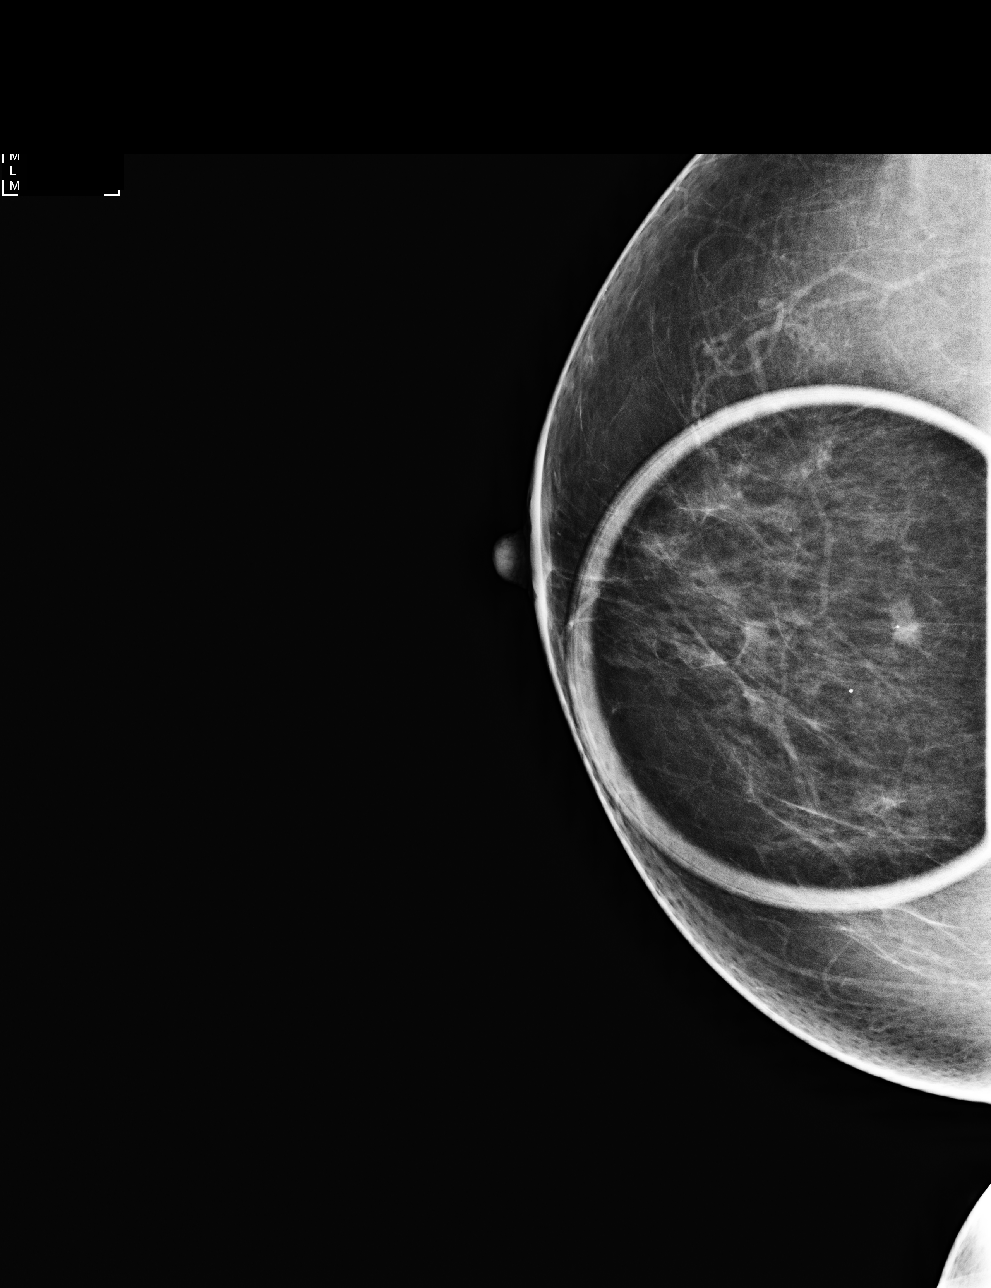

[R MLO]
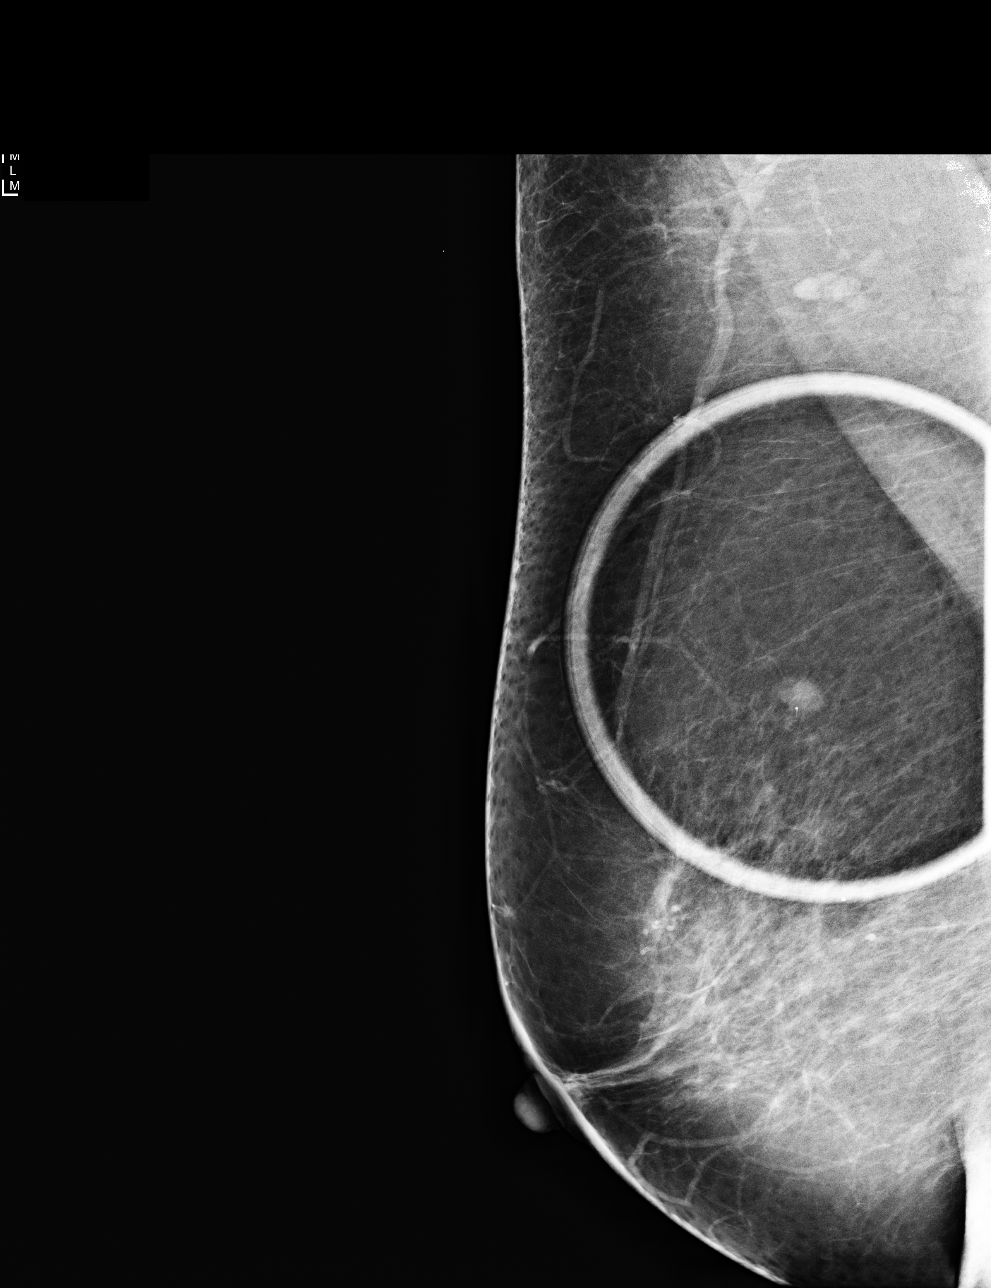

[R CC tomo · tomo slice 37/73.0]
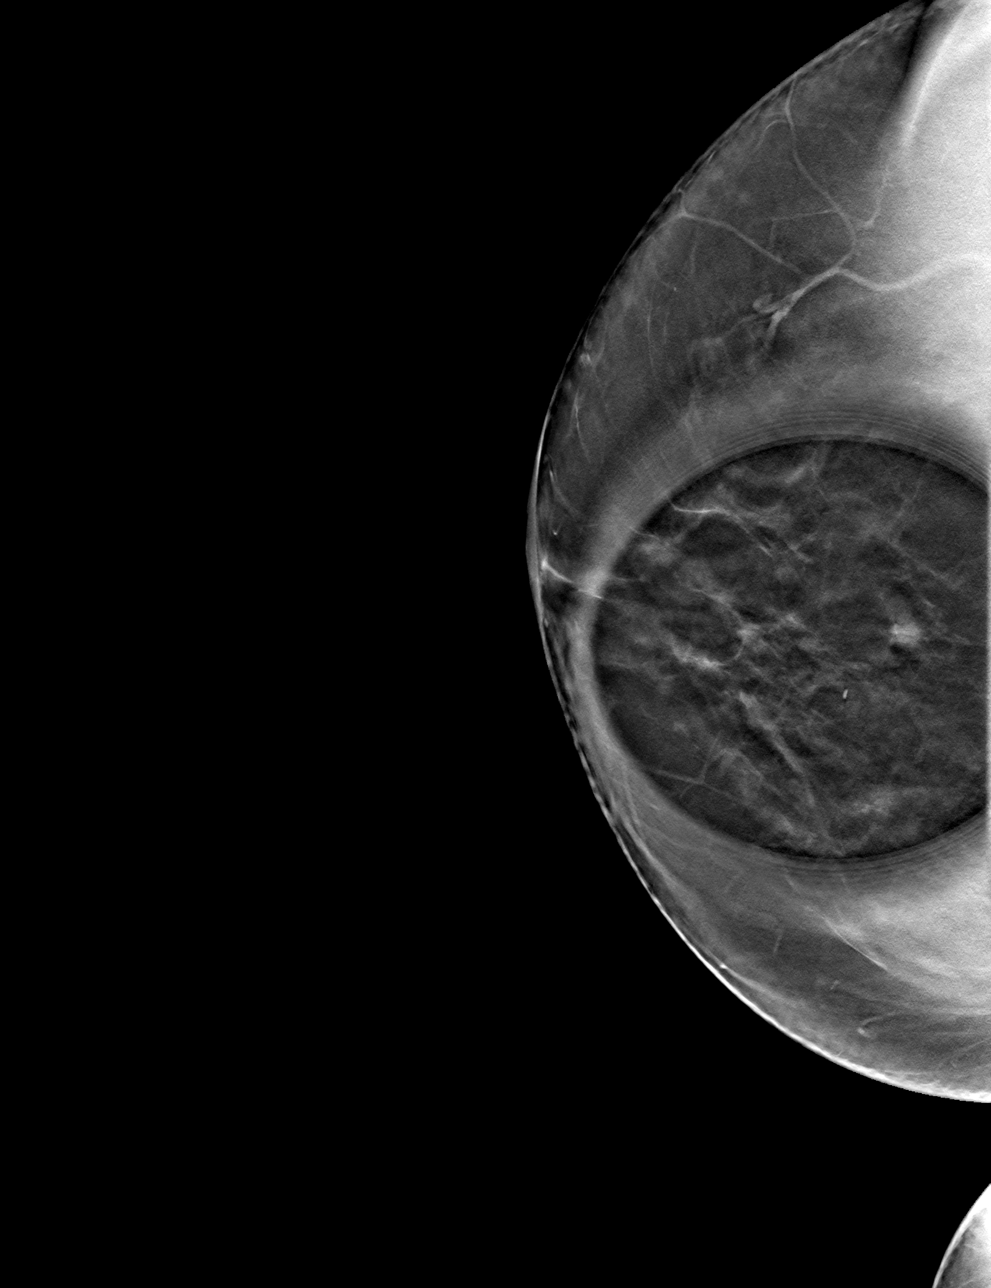

[4 of 8 positions shown; findings below may reference images not displayed]

ACR Breast Density Category b: There are scattered areas of
fibroglandular density.
FINDINGS: On spot compression imaging, the possible mass in the superior right
breast persists. It is oval with irregular margins. Small
calcification is noted along its anterior margin.

There are no other masses.  There is no architectural distortion.

Mammographic images were processed with CAD.

On physical exam, no mass is palpated in the upper right breast.

Targeted ultrasound is performed, showing a small hypoechoic mass in
12:30 o'clock position of the right breast 5 cm the nipple,
measuring 7 x 4 x 6 mm. It is oval in shape with mildly irregular
and partly ill-defined margins. It cyst demonstrates internal blood
flow on color Doppler analysis. Evaluation of the right axilla shows
no enlarged or abnormal lymph nodes.
IMPRESSION: 1. 7 mm mass in the 12:30 o'clock position of the right breast
suspicious for a breast malignancy. Biopsy is indicated.

RECOMMENDATION:
Ultrasound-guided core needle biopsy.

I have discussed the findings and recommendations with the patient.
Results were also provided in writing at the conclusion of the
visit. If applicable, a reminder letter will be sent to the patient
regarding the next appointment.

BI-RADS CATEGORY  5: Highly suggestive of malignancy.

## 2017-10-05 MED ORDER — ANASTROZOLE 1 MG PO TABS: 1.0000 mg | ORAL_TABLET | Freq: Every day | ORAL | 3 refills | Status: DC

## 2017-10-05 NOTE — Progress Notes (Signed)
Patient Care Team: Plotnikov, Evie Lacks, MD as PCP - General [name], MD as Consulting Physician (Obstetrics and Gynecology) Nicholas Lose, MD as Consulting Physician (Hematology and Oncology) Jovita Kussmaul, MD as Consulting Physician (General Surgery) Kyung Rudd, MD as Consulting Physician (Radiation Oncology)  DIAGNOSIS:  Encounter Diagnosis  Name Primary?  . Malignant neoplasm of upper-inner quadrant of right breast in female, estrogen receptor positive ([place])     SUMMARY OF ONCOLOGIC HISTORY:   Breast cancer of upper-inner quadrant of right female breast ([place])   07/04/2015 Mammogram    Right breast mass 7 mm 12:30 position    07/05/2015 Initial Diagnosis    Right breast biopsy 12:30 position: Invasive ductal carcinoma grade 2, ER 100%, PR 95%, HER-2 negative ratio 1.29, K 67 15%, T1 BN 0 stage IA    08/02/2015 Surgery    Right lumpectomy [name]): Invasive ductal carcinoma grade 2, 0.7 cm, margins negative, 0/3 sentinel nodes, ER 100%, PR 95%, HER-2 negative ratio 1.17, Ki-67 15%, T1 BN 0 stage IA Oncotype DX score 21, 13% ROR    09/16/2015 - 10/11/2015 Radiation Therapy    Radiation [name]) to right breast: 42.5Gy in 17 fractions, right breast boost: 7.5 Gy in 3 fractions.      12/09/2015 -  Anti-estrogen oral therapy    Anastrozole 1 mg by mouth daily     CHIEF COMPLIANT: Follow-up on anastrozole therapy  INTERVAL HISTORY: Kristy Singh is a 67 year old with above-mentioned history of right breast cancer underwent lumpectomy radiation is currently on anastrozole therapy.  She appears to be tolerating it extremely well.  She denies any side effects to anastrozole.  Hot flashes are very mild.  Recent mammograms were normal.  REVIEW OF SYSTEMS:   Constitutional: Denies fevers, chills or abnormal weight loss Eyes: Denies blurriness of vision Ears, nose, mouth, throat, and face: Denies mucositis or sore throat Respiratory: Denies cough, dyspnea or  wheezes Cardiovascular: Denies palpitation, chest discomfort Gastrointestinal:  Denies nausea, heartburn or change in bowel habits Skin: Denies abnormal skin rashes Lymphatics: Denies new lymphadenopathy or easy bruising Neurological:Denies numbness, tingling or new weaknesses Behavioral/Psych: Mood is stable, no new changes  Extremities: No lower extremity edema Breast:  denies any pain or lumps or nodules in either breasts All other systems were reviewed with the patient and are negative.  I have reviewed the past medical history, past surgical history, social history and family history with the patient and they are unchanged from previous note.  ALLERGIES:  has No Known Allergies.  MEDICATIONS:  Current Outpatient Medications  Medication Sig Dispense Refill  . anastrozole (ARIMIDEX) 1 MG tablet TAKE 1 TABLET DAILY 90 tablet 3  . Cholecalciferol (VITAMIN D3) 1000 UNITS tablet Take 1,000 Units by mouth daily.      [name] Calcium (STOOL SOFTENER PO) Take 1 tablet by mouth every other day. [place] stool softener    . rosuvastatin (CRESTOR) 10 MG tablet Take 1 tablet (10 mg total) by mouth daily. 90 tablet 3  . Turmeric 500 MG CAPS Take 500 mg by mouth daily.     No current facility-administered medications for this visit.     PHYSICAL EXAMINATION: ECOG PERFORMANCE STATUS: 0 - Asymptomatic  Vitals:   10/05/17 1017  BP: 122/71  Pulse: 63  Resp: 17  Temp: 98.2 F (36.8 C)  SpO2: 99%   Filed Weights   10/05/17 1017  Weight: 186 lb 14.4 oz (84.8 kg)    GENERAL:alert, no distress and comfortable SKIN: skin  color, texture, turgor are normal, no rashes or significant lesions EYES: normal, Conjunctiva are pink and non-injected, sclera clear OROPHARYNX:no exudate, no erythema and lips, buccal mucosa, and tongue normal  NECK: supple, thyroid normal size, non-tender, without nodularity LYMPH:  no palpable lymphadenopathy in the cervical, axillary or inguinal LUNGS: clear to  auscultation and percussion with normal breathing effort HEART: regular rate & rhythm and no murmurs and no lower extremity edema ABDOMEN:abdomen soft, non-tender and normal bowel sounds MUSCULOSKELETAL:no cyanosis of digits and no clubbing  NEURO: alert & oriented x 3 with fluent speech, no focal motor/sensory deficits EXTREMITIES: No lower extremity edema BREAST: No palpable masses or nodules in either right or left breasts. No palpable axillary supraclavicular or infraclavicular adenopathy no breast tenderness or nipple discharge. (exam performed in the presence of a chaperone)  LABORATORY DATA:  I have reviewed the data as listed CMP Latest Ref Rng & Units 12/04/2016 12/03/2015 11/28/2013  Glucose 70 - 99 mg/dL 98 91 75  BUN 6 - 23 mg/dL _0 Creatinine 0.40 - 1.20 mg/dL 0.74 0.78 0.8  Sodium 135 - 145 mEq/L 143 143 143  Potassium 3.5 - 5.1 mEq/L 4.6 3.5 4.8  Chloride 96 - 112 mEq/L 109 109 108  CO2 19 - 32 mEq/L _1 Calcium 8.4 - 10.5 mg/dL 9.6 9.4 9.8  Total Protein 6.0 - 8.3 g/dL 7.1 6.7 7.6  Total Bilirubin 0.2 - 1.2 mg/dL 0.6 0.7 0.7  Alkaline Phos 39 - 117 U/L 90 87 89  AST 0 - 37 U/L _2 ALT 0 - 35 U/L _3 Lab Results  Component Value Date   WBC 4.3 12/04/2016   HGB 14.9 12/04/2016   HCT 44.3 12/04/2016   MCV 92.5 12/04/2016   PLT 331.0 12/04/2016   NEUTROABS 2.2 12/04/2016    ASSESSMENT & PLAN:  Breast cancer of upper-inner quadrant of right female breast ([place]) Right lumpectomy 08/02/2015: Invasive ductal carcinoma grade 2, 0.7 cm, margins negative, 0/3 sentinel nodes, ER 100%, PR 95%, HER-2 negative ratio 1.29, Ki-67 15%, T1 BN 0 stage IA Oncotype DX score 21, 13% ROR, did not require chemotherapy, intermediate risk Adjuvant radiation therapy from 09/16/2015 to 10/11/2015  Treatment plan: Adjuvant antiestrogen therapy with anastrozole 1 mg by mouth daily 5-7 years started 12/09/2015  Anastrozole toxicities: Denies myalgias. Patient  has had chronic mild hot flashes which are unchanged  Breast cancer surveillance:   mammogram 06/25/2017: Benign breast density category C I recommended that she get a bone density test.  Return to clinic in 1 year for follow-up and breast exams    No orders of the defined types were placed in this encounter.  The patient has a good understanding of the overall plan. she agrees with it. she will call with any problems that may develop before the next visit here.   Harriette Ohara, MD 10/05/17

## 2017-10-05 NOTE — Telephone Encounter (Signed)
Gave patient avs report and appointments for August 2020. Patient will be contacted re dexa scan. Info for [facility] provided.

## 2017-10-05 NOTE — Assessment & Plan Note (Signed)
Right lumpectomy 08/02/2015: Invasive ductal carcinoma grade 2, 0.7 cm, margins negative, 0/3 sentinel nodes, ER 100%, PR 95%, HER-2 negative ratio 1.29, Ki-67 15%, T1 BN 0 stage IA Oncotype DX score 21, 13% ROR, did not require chemotherapy, intermediate risk Adjuvant radiation therapy from 09/16/2015 to 10/11/2015  Treatment plan: Adjuvant antiestrogen therapy with anastrozole 1 mg by mouth daily 5-7 years started 12/09/2015  Anastrozole toxicities: Denies myalgias. Patient has had chronic mild hot flashes which are unchanged Breast cancer surveillance:   mammogram 06/25/2017: Benign breast density category C  Return to clinic in 1 year for follow-up and breast exams

## 2017-10-06 IMAGING — US US RT BREAST BX
1 series · 13 of 14 positions shown · non-contrast
Comparison: Previous exam(s).

ADDENDUM:
Pathology revealed grade II invasive ductal carcinoma in the right
breast. This was found to be concordant by Dr. Mambale Alcock.
Pathology results were discussed with the patient by telephone. The
patient reported doing well after the biopsy. Post biopsy
instructions and care were reviewed and questions were answered. The
patient was encouraged to call [REDACTED] for any additional concerns. Surgical consultation has been
arranged with Dr. Shameeka Shull at [REDACTED]
on July 11, 2015. The patient was encouraged to come to The [REDACTED] for educational materials.

Pathology results reported by Eujin Pin RN, BSN on 07/09/2015.
CLINICAL DATA: Patient presents for ultrasound-guided core needle
biopsy or of a small, upper inner quadrant, 12:30 o'clock position,
right breast mass.
EXAM:
ULTRASOUND GUIDED RIGHT BREAST CORE NEEDLE BIOPSY

[Series 1: us right breast bx · 0.07mm/px · 13 of 14 slices shown]
[im 1/14]
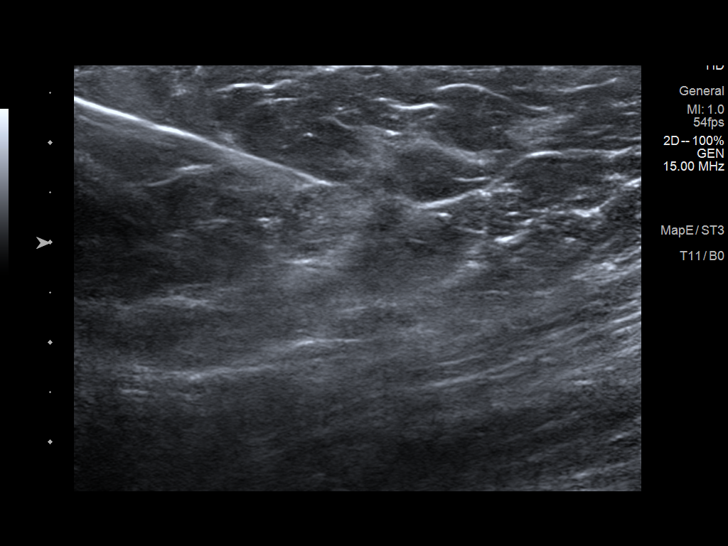
[im 2/14]
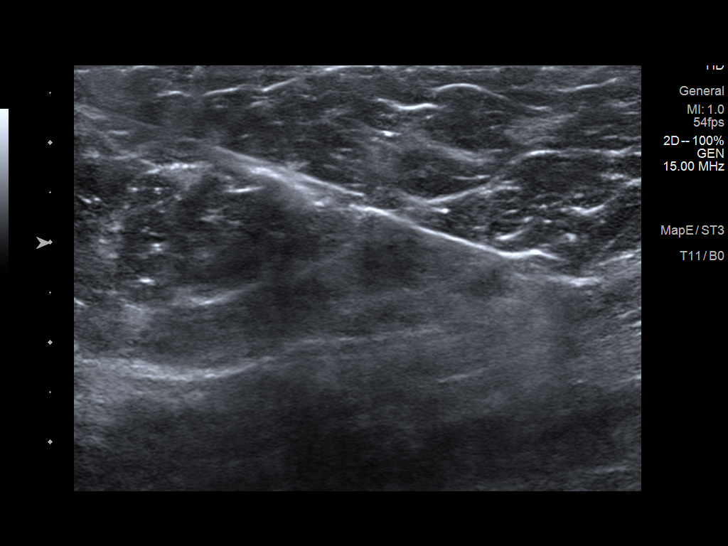
[im 3/14]
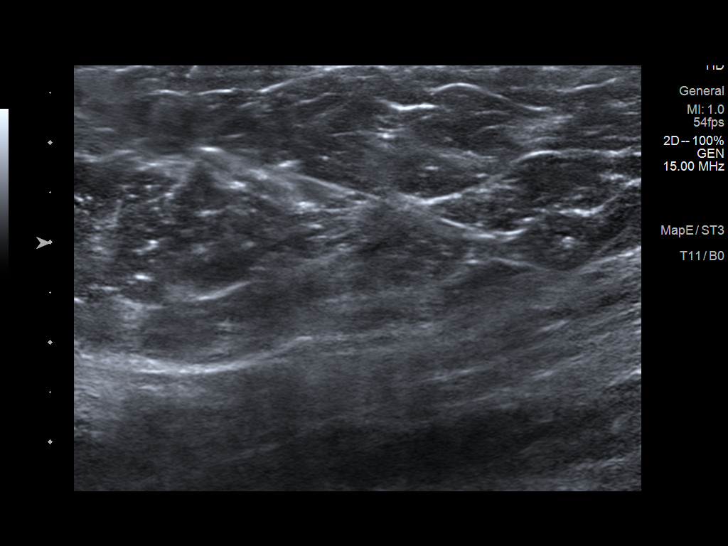
[im 4/14]
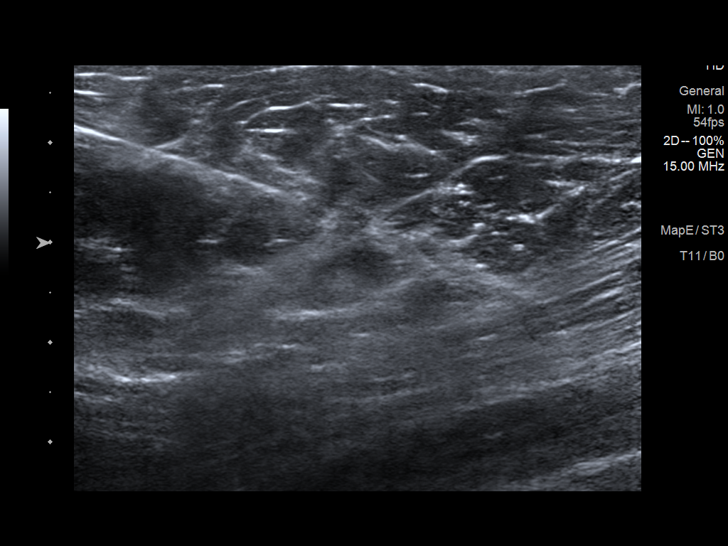
[im 5/14]
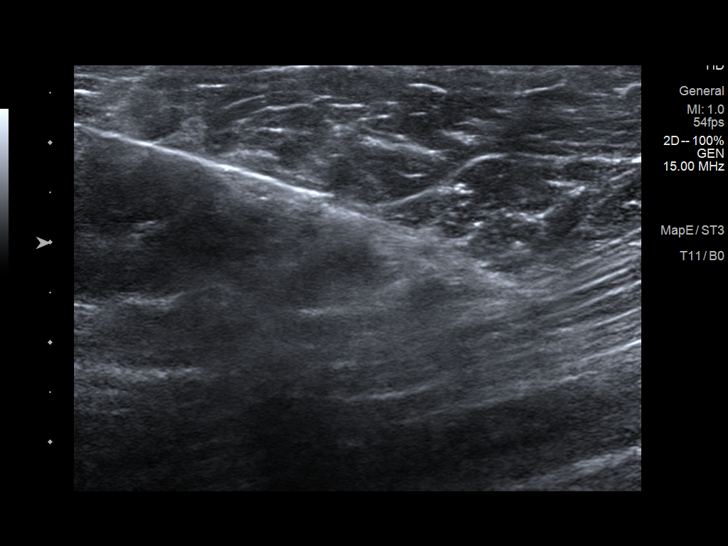
[im 6/14]
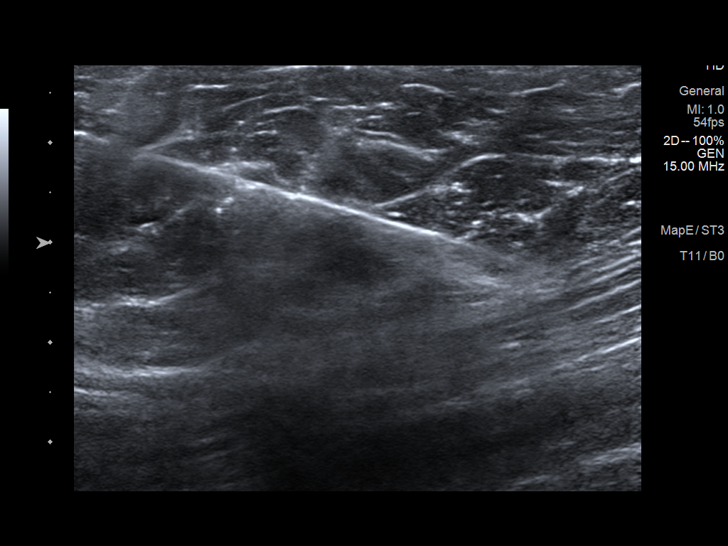
[im 8/14]
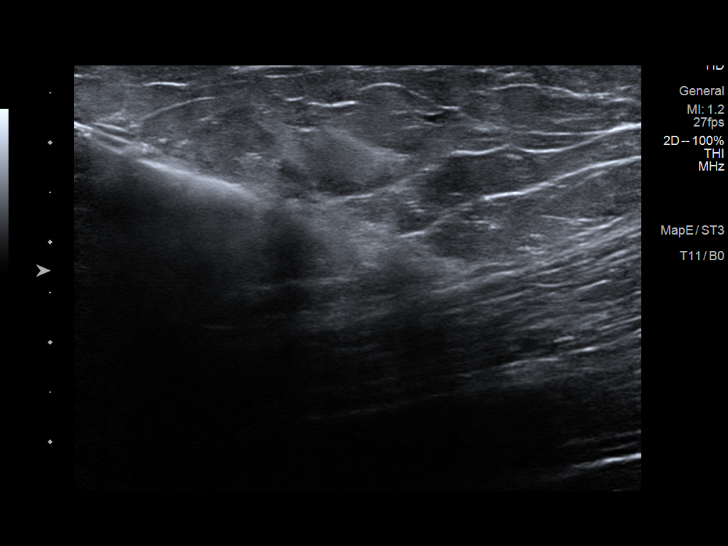
[im 9/14]
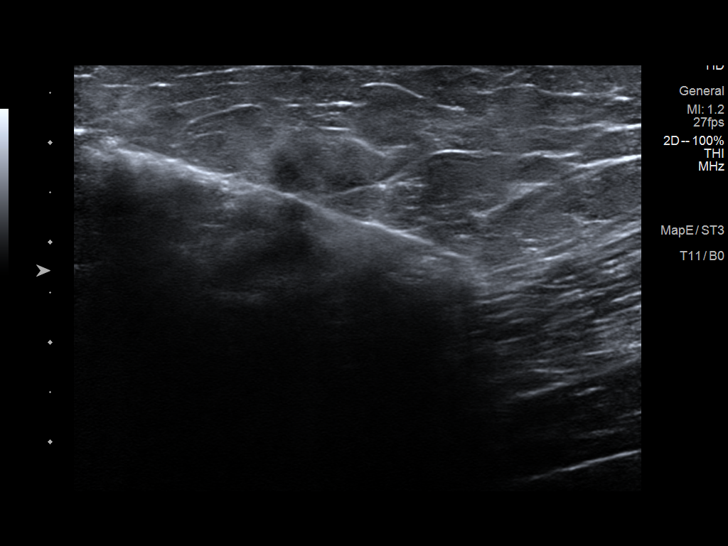
[im 10/14]
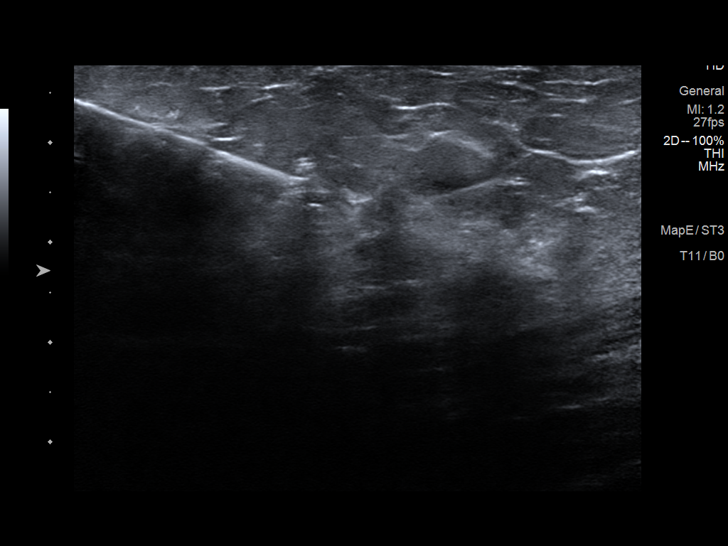
[im 11/14]
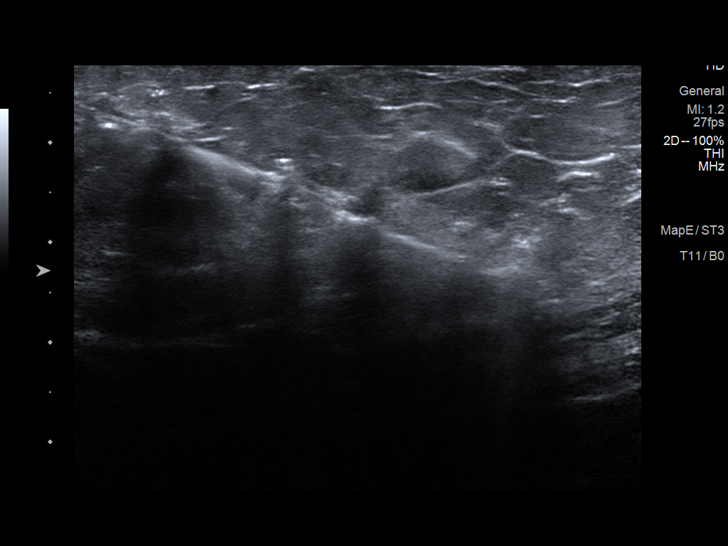
[im 12/14]
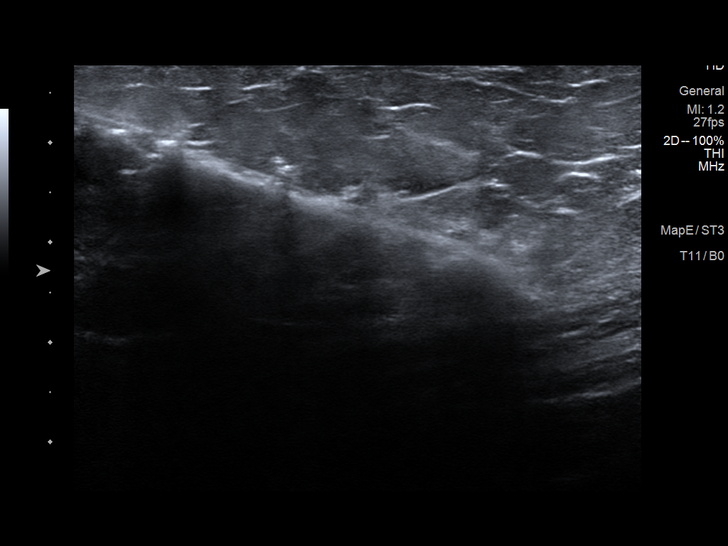
[im 13/14]
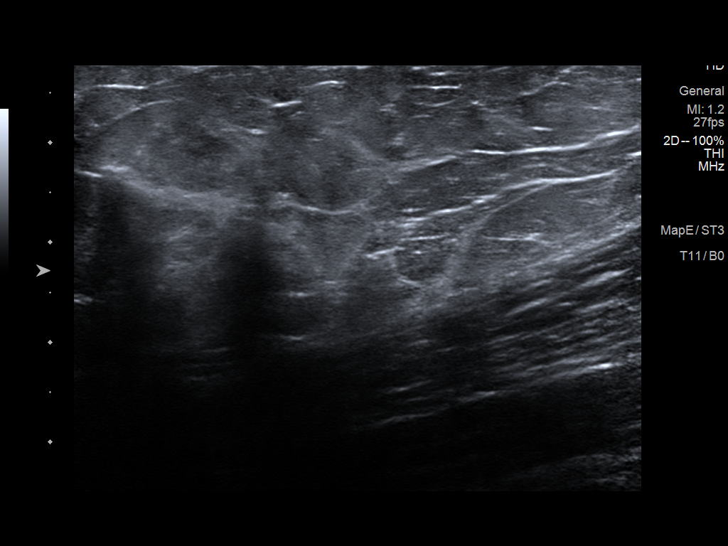
[im 14/14]
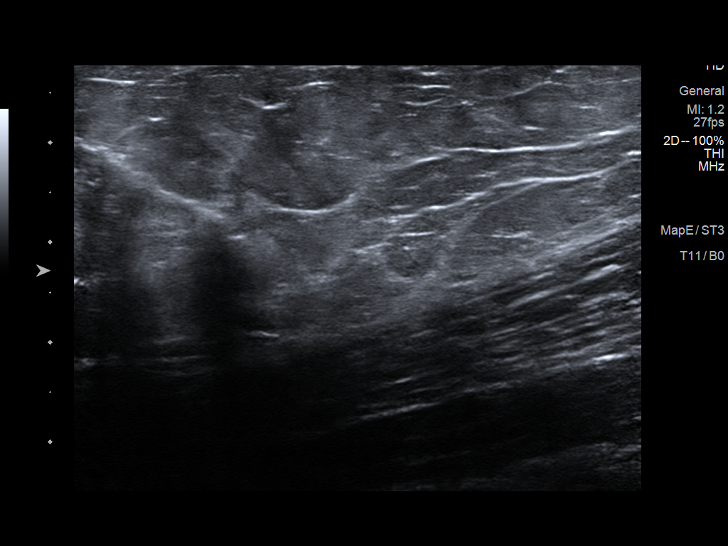

[13 of 14 positions shown; findings below may reference images not displayed]



Using sterile technique and 1% Lidocaine as local anesthetic, under
direct ultrasound visualization, a 14 gauge Xawa Abdirahman device was
used to perform biopsy of the 12:30 o'clock position mass using an
inferior approach. At the conclusion of the procedure a ribbon
shaped tissue marker clip was deployed into the biopsy cavity.
Follow up 2 view mammogram was performed and dictated separately.
IMPRESSION: Ultrasound guided biopsy of a small right breast mass. No apparent
complications.

## 2017-10-12 ENCOUNTER — Ambulatory Visit
Admission: RE | Admit: 2017-10-12 | Discharge: 2017-10-12 | Disposition: A | Discharge status: Home / Self Care | Payer: 59 | Source: Ambulatory Visit | Attending: Hematology and Oncology | Admitting: Hematology and Oncology

## 2017-10-12 DIAGNOSIS — Z780 Asymptomatic menopausal state: Secondary | ICD-10-CM

## 2017-10-27 ENCOUNTER — Other Ambulatory Visit: Payer: Self-pay

## 2017-10-27 MED ORDER — ANASTROZOLE 1 MG PO TABS: 1.0000 mg | ORAL_TABLET | Freq: Every day | ORAL | 0 refills | Status: DC

## 2017-10-27 NOTE — Progress Notes (Signed)
Pt called requesting to have a few anastrozole pills sent to [place]. Pt left out of town to visit her mother in [place] and forgot her medication. She does not want to miss out on her pills and would like to see if our office could send in 5 pills until she gets back in [place]. Sent qty 5 pills to [place] [facility] as [facility]. Pt very thankful for the call and has no further concerns at this time.

## 2017-12-08 ENCOUNTER — Encounter: Payer: 59 | Admitting: Internal Medicine

## 2017-12-15 ENCOUNTER — Encounter: Payer: Self-pay | Admitting: Internal Medicine

## 2017-12-15 ENCOUNTER — Other Ambulatory Visit (INDEPENDENT_AMBULATORY_CARE_PROVIDER_SITE_OTHER): Payer: 59

## 2017-12-15 ENCOUNTER — Ambulatory Visit (INDEPENDENT_AMBULATORY_CARE_PROVIDER_SITE_OTHER): Payer: 59 | Admitting: Internal Medicine

## 2017-12-15 VITALS — BP 122/76 | HR 55 | Temp 98.2°F | Ht 65.0 in | Wt 190.0 lb

## 2017-12-15 DIAGNOSIS — Z170 Estrogen receptor positive status [ER+]: Secondary | ICD-10-CM | POA: Diagnosis not present

## 2017-12-15 DIAGNOSIS — C50211 Malignant neoplasm of upper-inner quadrant of right female breast: Secondary | ICD-10-CM

## 2017-12-15 DIAGNOSIS — Z23 Encounter for immunization: Secondary | ICD-10-CM

## 2017-12-15 DIAGNOSIS — Z0000 Encounter for general adult medical examination without abnormal findings: Secondary | ICD-10-CM

## 2017-12-15 DIAGNOSIS — Z86010 Personal history of colonic polyps: Secondary | ICD-10-CM

## 2017-12-15 LAB — URINALYSIS, ROUTINE W REFLEX MICROSCOPIC
Bilirubin Urine: NEGATIVE
Hgb urine dipstick: NEGATIVE
KETONES UR: NEGATIVE
Nitrite: NEGATIVE
PH: 6 (ref 5.0–8.0)
SPECIFIC GRAVITY, URINE: 1.02 (ref 1.000–1.030)
Total Protein, Urine: NEGATIVE
Urine Glucose: NEGATIVE
Urobilinogen, UA: 0.2 (ref 0.0–1.0)

## 2017-12-15 LAB — CBC WITH DIFFERENTIAL/PLATELET
BASOS ABS: 0.1 10*3/uL (ref 0.0–0.1)
Basophils Relative: 2.3 % (ref 0.0–3.0)
Eosinophils Absolute: 0.2 10*3/uL (ref 0.0–0.7)
Eosinophils Relative: 3.5 % (ref 0.0–5.0)
HEMATOCRIT: 43.1 % (ref 36.0–46.0)
Hemoglobin: 14.7 g/dL (ref 12.0–15.0)
LYMPHS PCT: 41.5 % (ref 12.0–46.0)
Lymphs Abs: 1.8 10*3/uL (ref 0.7–4.0)
MCHC: 34.1 g/dL (ref 30.0–36.0)
MCV: 91 fl (ref 78.0–100.0)
MONOS PCT: 8.3 % (ref 3.0–12.0)
Monocytes Absolute: 0.4 10*3/uL (ref 0.1–1.0)
NEUTROS ABS: 1.9 10*3/uL (ref 1.4–7.7)
Neutrophils Relative %: 44.4 % (ref 43.0–77.0)
Platelets: 333 10*3/uL (ref 150.0–400.0)
RBC: 4.74 Mil/uL (ref 3.87–5.11)
RDW: 13.1 % (ref 11.5–15.5)
WBC: 4.3 10*3/uL (ref 4.0–10.5)

## 2017-12-15 LAB — BASIC METABOLIC PANEL
BUN: 21 mg/dL (ref 6–23)
CO2: 25 meq/L (ref 19–32)
CREATININE: 0.83 mg/dL (ref 0.40–1.20)
Calcium: 9.7 mg/dL (ref 8.4–10.5)
Chloride: 108 mEq/L (ref 96–112)
GFR: 73.29 mL/min (ref >60.00)
Glucose, Bld: 94 mg/dL (ref 70–99)
Potassium: 3.9 mEq/L (ref 3.5–5.1)
Sodium: 141 mEq/L (ref 135–145)

## 2017-12-15 LAB — LIPID PANEL
CHOL/HDL RATIO: 3
Cholesterol: 176 mg/dL (ref 0–200)
HDL: 54.3 mg/dL (ref >39.00)
LDL Cholesterol: 91 mg/dL (ref 0–99)
NONHDL: 121.44
TRIGLYCERIDES: 152 mg/dL — AB (ref 0.0–149.0)
VLDL: 30.4 mg/dL (ref 0.0–40.0)

## 2017-12-15 LAB — HEPATIC FUNCTION PANEL
ALBUMIN: 4.5 g/dL (ref 3.5–5.2)
ALT: 17 U/L (ref 0–35)
AST: 13 U/L (ref 0–37)
Alkaline Phosphatase: 88 U/L (ref 39–117)
Bilirubin, Direct: 0.1 mg/dL (ref 0.0–0.3)
Total Bilirubin: 0.6 mg/dL (ref 0.2–1.2)
Total Protein: 7.1 g/dL (ref 6.0–8.3)

## 2017-12-15 LAB — TSH: TSH: 3.81 u[IU]/mL (ref 0.35–4.50)

## 2017-12-15 NOTE — Assessment & Plan Note (Signed)
  Colon due 2022, last 2019

## 2017-12-15 NOTE — Assessment & Plan Note (Signed)
Dr Lindi Adie, Dr [name] f/u q 12 mo S/p XRT, surgery

## 2017-12-15 NOTE — Addendum Note (Signed)
Addended by: Karren Cobble on: 12/15/2017 08:40 AM   Modules accepted: Orders

## 2017-12-15 NOTE — Assessment & Plan Note (Signed)
°

## 2017-12-15 NOTE — Progress Notes (Signed)
Subjective:  Patient ID: Kristy Singh, female    DOB: 29-Jul-1950  Age: 67 y.o. MRN: [id number]  CC: No chief complaint on file.   HPI Kristy Singh presents for a well exam   Outpatient Medications Prior to Visit  Medication Sig Dispense Refill  . anastrozole (ARIMIDEX) 1 MG tablet Take 1 tablet (1 mg total) by mouth daily. 5 tablet 0  . Cholecalciferol (VITAMIN D3) 1000 UNITS tablet Take 1,000 Units by mouth daily.      Kristy Singh Calcium (STOOL SOFTENER PO) Take 1 tablet by mouth every other day. Rexall stool softener    . rosuvastatin (CRESTOR) 10 MG tablet Take 1 tablet (10 mg total) by mouth daily. 90 tablet 3  . Turmeric 500 MG CAPS Take 500 mg by mouth daily.    Marland Kitchen anastrozole (ARIMIDEX) 1 MG tablet Take 1 tablet (1 mg total) by mouth daily. 90 tablet 3   No facility-administered medications prior to visit.     ROS: Review of Systems  Constitutional: Negative for activity change, appetite change, chills, fatigue and unexpected weight change.  HENT: Negative for congestion, mouth sores and sinus pressure.   Eyes: Negative for visual disturbance.  Respiratory: Negative for cough and chest tightness.   Gastrointestinal: Negative for abdominal pain and nausea.  Genitourinary: Negative for difficulty urinating, frequency and vaginal pain.  Musculoskeletal: Negative for back pain and gait problem.  Skin: Negative for pallor and rash.  Neurological: Negative for dizziness, tremors, weakness, numbness and headaches.  Psychiatric/Behavioral: Negative for confusion, sleep disturbance and suicidal ideas.    Objective:  BP 122/76 (BP Location: Left Arm, Patient Position: Sitting, Cuff Size: Normal)   Pulse (!) 55   Temp 98.2 F (36.8 C) (Oral)   Ht 5\' 5"  (1.651 m)   Wt 190 lb (86.2 kg)   SpO2 98%   BMI 31.62 kg/m   BP Readings from Last 3 Encounters:  12/15/17 122/76  10/05/17 122/71  08/11/17 110/80    Wt Readings from Last 3 Encounters:  12/15/17 190 lb (86.2 kg)    10/05/17 186 lb 14.4 oz (84.8 kg)  08/11/17 185 lb 6 oz (84.1 kg)    Physical Exam  Constitutional: She appears well-developed. No distress.  HENT:  Head: Normocephalic.  Right Ear: External ear normal.  Left Ear: External ear normal.  Nose: Nose normal.  Mouth/Throat: Oropharynx is clear and moist.  Eyes: Pupils are equal, round, and reactive to light. Conjunctivae are normal. Right eye exhibits no discharge. Left eye exhibits no discharge.  Neck: Normal range of motion. Neck supple. No JVD present. No tracheal deviation present. No thyromegaly present.  Cardiovascular: Normal rate, regular rhythm and normal heart sounds.  Pulmonary/Chest: No stridor. No respiratory distress. She has no wheezes.  Abdominal: Soft. Bowel sounds are normal. She exhibits no distension and no mass. There is no tenderness. There is no rebound and no guarding.  Musculoskeletal: She exhibits no edema or tenderness.  Lymphadenopathy:    She has no cervical adenopathy.  Neurological: She displays normal reflexes. No cranial nerve deficit. She exhibits normal muscle tone. Coordination normal.  Skin: No rash noted. No erythema.  Psychiatric: She has a normal mood and affect. Her behavior is normal. Judgment and thought content normal.    Lab Results  Component Value Date   WBC 4.3 12/04/2016   HGB 14.9 12/04/2016   HCT 44.3 12/04/2016   PLT 331.0 12/04/2016   GLUCOSE 98 12/04/2016   CHOL 179 12/04/2016   TRIG 125.0  12/04/2016   HDL 58.80 12/04/2016   LDLDIRECT 189.4 08/29/2010   LDLCALC 95 12/04/2016   ALT 16 12/04/2016   AST 18 12/04/2016   NA 143 12/04/2016   K 4.6 12/04/2016   CL 109 12/04/2016   CREATININE 0.74 12/04/2016   BUN 18 12/04/2016   CO2 20 12/04/2016   TSH 3.96 12/04/2016    Dg Bone Density  Result Date: 10/12/2017 EXAM: DUAL X-RAY ABSORPTIOMETRY (DXA) FOR BONE MINERAL DENSITY IMPRESSION: Referring Physician:  Nicholas Lose Your patient completed a BMD test using Lunar IDXA DXA  system ( analysis version: 16 ) manufactured by [employer]. PATIENT: Name: Kristy Singh, Kristy Singh Patient ID: [id number] Birth Date: 1950-11-19 Height: 65.0 in. Sex: Female Measured: 10/12/2017 Weight: 176.6 lbs. Indications: Arimidex, Breast Cancer History, Caucasian, Estrogen Deficient, Hysterectomy, Low Calcium Intake (269.3), One ovary removed, Postmenopausal, Secondary Osteoporosis Fractures: None Treatments: Vitamin D (E933.5) ASSESSMENT: The BMD measured at [facility] Sp[facility] L1-L3 is 0.959 g/cm2 with a T-score of -1.8. This patient is considered osteopenic according to [facility]) criteria. The scan quality is good. L-4 was excluded due to degenerative changes. Site Region Measured Date Measured Age YA BMD Significant CHANGE T-score AP Spine  L1-L3      10[age]12/2017    64.9         -1.8    0.959 g/cm2 DualFemur Neck Right 10/12/2017    64.9         -0.9    0.906 g/cm2 DualFemur Total Mean 10/12/2017    64.9         -0.1    0.995 g/cm2 World Health Organization [facility]) criteria for post-menopausal, Caucasian Women: Normal       T-score at or above -1 SD Osteopenia   T-score between -1 and -2.5 SD Osteoporosis T-score at or below -2.5 SD RECOMMENDATION: 1. All patients should optimize calcium and vitamin D intake. 2. Consider FDA approved medical therapies in postmenopausal women and men aged 55 years and older, based on the following: a. A hip or vertebral (clinical or morphometric) fracture b. T- score < or = -2.5 at the femoral neck or spine after appropriate evaluation to exclude secondary causes c. Low bone mass (T-score between -1.0 and -2.5 at the femoral neck or spine) and a 10 year probability of a hip fracture > or = 3% or a 10 year probability of a major osteoporosis-related fracture > or = 20% based on the [place]-adapted WHO algorithm d. Clinician judgment and/or patient preferences may indicate treatment for people with 10-year fracture probabilities above or below these levels FOLLOW-UP: People with  diagnosed cases of osteoporosis or at high risk for fracture should have regular bone mineral density tests. For patients eligible for Medicare, routine testing is allowed once every 2 years. The testing frequency can be increased to one year for patients who have rapidly progressing disease, those who are receiving or discontinuing medical therapy to restore bone mass, or have additional risk factors. I have reviewed this report and agree with the above findings. [facility] FRAX* 10-year Probability of Fracture Based on femoral neck BMD: DualFemur (Right) Major Osteoporotic Fracture: 7.7% Hip Fracture:                0.5% Population:                  [place] (Caucasian) Risk Factors:                Secondary Osteoporosis *FRAX is a Materials engineer of the  [employer] for Metabolic Bone Disease, a World Pharmacologist (WHO) [facility]. ASSESSMENT: The probability of a major osteoporotic fracture is 7.7 % within the next ten years. The probability of hip fracture is 0.5 % within the next 10 years. Electronically Signed   By: Marijo Conception, M.D.   On: 10/12/2017 08:48    Assessment & Plan:   There are no diagnoses linked to this encounter.   No orders of the defined types were placed in this encounter.    Follow-up: No follow-ups on file.  Walker Kehr, MD

## 2018-03-11 ENCOUNTER — Other Ambulatory Visit: Payer: Self-pay

## 2018-03-11 MED ORDER — ANASTROZOLE 1 MG PO TABS: 1.0000 mg | ORAL_TABLET | Freq: Every day | ORAL | 0 refills | Status: DC

## 2018-03-18 ENCOUNTER — Other Ambulatory Visit: Payer: Self-pay

## 2018-03-18 MED ORDER — ANASTROZOLE 1 MG PO TABS: 1.0000 mg | ORAL_TABLET | Freq: Every day | ORAL | 0 refills | Status: DC

## 2018-04-10 ENCOUNTER — Other Ambulatory Visit: Payer: Self-pay | Admitting: Hematology and Oncology

## 2018-05-16 ENCOUNTER — Other Ambulatory Visit: Payer: Self-pay | Admitting: Internal Medicine

## 2018-07-04 ENCOUNTER — Other Ambulatory Visit: Payer: Self-pay | Admitting: Hematology and Oncology

## 2018-07-04 DIAGNOSIS — Z853 Personal history of malignant neoplasm of breast: Secondary | ICD-10-CM

## 2018-07-14 ENCOUNTER — Other Ambulatory Visit: Payer: Self-pay

## 2018-07-14 ENCOUNTER — Ambulatory Visit
Admission: RE | Admit: 2018-07-14 | Discharge: 2018-07-14 | Disposition: A | Discharge status: Home / Self Care | Payer: 59 | Source: Ambulatory Visit | Attending: Hematology and Oncology | Admitting: Hematology and Oncology

## 2018-07-14 DIAGNOSIS — Z853 Personal history of malignant neoplasm of breast: Secondary | ICD-10-CM

## 2018-09-27 NOTE — Assessment & Plan Note (Signed)
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 Patient has had chronic mild hot flashes which are unchanged  Breast cancer surveillance: mammogram  07/14/2018: Benign breast density category  B I recommended that she get a bone density test.

## 2018-09-28 ENCOUNTER — Other Ambulatory Visit: Payer: Self-pay | Admitting: Hematology and Oncology

## 2018-09-28 ENCOUNTER — Telehealth: Payer: Self-pay | Admitting: Hematology and Oncology

## 2018-09-28 NOTE — Telephone Encounter (Signed)
I talk with patient regarding video visit °

## 2018-09-29 NOTE — Telephone Encounter (Signed)
10-04-2018 Video F/U

## 2018-10-03 ENCOUNTER — Telehealth: Payer: Self-pay | Admitting: Hematology and Oncology

## 2018-10-03 NOTE — Progress Notes (Signed)
HEMATOLOGY-ONCOLOGY MYCHART VIDEO VISIT PROGRESS NOTE  I connected with Kristy Singh on 10/04/2018 at  9:00 AM EDT by MyChart video conference and verified that I am speaking with the correct person using two identifiers.  I discussed the limitations, risks, security and privacy concerns of performing an evaluation and management service by MyChart and the availability of in person appointments.  I also discussed with the patient that there may be a patient responsible charge related to this service. The patient expressed understanding and agreed to proceed.   Patient's Location: Home Physician Location: Clinic  CHIEF COMPLIANT: Follow-up of right breast cancer on anastrozole  INTERVAL HISTORY: Kristy Singh is a 68 y.o. female with above-mentioned history of right breast cancer who underwent a lumpectomy, radiation, and is currently on anastrozole therapy. I last saw her a year ago. Bone density scan on 10/12/17 showed a T-score of -1.8 at the AP spine.  Mammogram on 07/14/18 revealed no evidence of malignancy. She presents over MyChart today for annual follow-up.   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     12/09/2015 -  Anti-estrogen oral therapy   Anastrozole 1 mg by mouth daily     REVIEW OF SYSTEMS:   Constitutional:  Denies fevers, chills or abnormal weight loss Eyes: Denies blurriness of vision Ears, nose, mouth, throat, and face: Denies mucositis or sore throat Respiratory: Denies cough, dyspnea or wheezes Cardiovascular: Denies palpitation, chest discomfort Gastrointestinal:  Denies nausea, heartburn or change in bowel habits Skin: Denies abnormal skin rashes Lymphatics: Denies new lymphadenopathy or easy bruising Neurological:Denies numbness, tingling or new weaknesses Behavioral/Psych: Mood is stable, no new changes  Extremities: No lower extremity edema Breast: denies any pain or lumps or nodules in either breasts All other systems were reviewed with the patient and are negative.  Observations/Objective:  There were no vitals filed for this visit. There is no height or weight on file to calculate BMI.  I have reviewed the data as listed CMP Latest Ref Rng & Units 12/15/2017 12/04/2016 12/03/2015  Glucose 70 - 99 mg/dL 94 98 91  BUN 6 - 23 mg/dL _0 Creatinine 0.40 - 1.20 mg/dL 0.83 0.74 0.78  Sodium 135 - 145 mEq/L 141 143 143  Potassium 3.5 - 5.1 mEq/L 3.9 4.6 3.5  Chloride 96 - 112 mEq/L 108 109 109  CO2 19 - 32 mEq/L _1 Calcium 8.4 - 10.5 mg/dL 9.7 9.6 9.4  Total Protein 6.0 - 8.3 g/dL 7.1 7.1 6.7  Total Bilirubin 0.2 - 1.2 mg/dL 0.6 0.6 0.7  Alkaline Phos 39 - 117 U/L 88 90 87  AST 0 - 37 U/L _2 ALT 0 - 35 U/L _3 Lab Results  Component Value Date   WBC  4.3 12/15/2017   HGB 14.7 12/15/2017   HCT 43.1 12/15/2017   MCV 91.0 12/15/2017   PLT 333.0 12/15/2017   NEUTROABS 1.9 12/15/2017      Assessment Plan:  Breast cancer of upper-inner quadrant of right female breast ([place]) Right lumpectomy 08/02/2015: Invasive ductal carcinoma grade 2, 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 Patient has had chronic mild hot flashes which are unchanged  Breast cancer surveillance: mammogram  07/14/2018: Benign breast density category  B Changed diet (less carbs and eating more fruits ane veg)   I discussed the assessment and treatment plan with the patient. The patient was provided an opportunity to ask questions and all were answered. The patient agreed with the plan and demonstrated an understanding of the instructions. The patient was advised to call back or seek an in-person evaluation if the symptoms worsen or if the condition fails to improve as anticipated.   I provided 15 minutes of face-to-face MyChart video visit time during this encounter.    Rulon Eisenmenger, MD 10/04/2018   I, Molly Dorshimer, am acting as scribe for Nicholas Lose, MD.  I have reviewed the above documentation for accuracy and completeness, and I agree with the above.

## 2018-10-04 ENCOUNTER — Inpatient Hospital Stay: Payer: 59 | Attending: Hematology and Oncology | Admitting: Hematology and Oncology

## 2018-10-04 ENCOUNTER — Telehealth: Payer: Self-pay | Admitting: Hematology and Oncology

## 2018-10-04 DIAGNOSIS — C50211 Malignant neoplasm of upper-inner quadrant of right female breast: Secondary | ICD-10-CM | POA: Diagnosis not present

## 2018-10-04 DIAGNOSIS — Z170 Estrogen receptor positive status [ER+]: Secondary | ICD-10-CM

## 2018-10-04 DIAGNOSIS — M8588 Other specified disorders of bone density and structure, other site: Secondary | ICD-10-CM

## 2018-10-04 MED ORDER — ANASTROZOLE 1 MG PO TABS: 1.0000 mg | ORAL_TABLET | Freq: Every day | ORAL | 3 refills | Status: DC

## 2018-10-04 NOTE — Telephone Encounter (Signed)
I talk with patient regarding schedule  

## 2018-12-19 ENCOUNTER — Encounter: Payer: 59 | Admitting: Internal Medicine

## 2019-01-03 ENCOUNTER — Ambulatory Visit (INDEPENDENT_AMBULATORY_CARE_PROVIDER_SITE_OTHER): Payer: 59 | Admitting: Internal Medicine

## 2019-01-03 ENCOUNTER — Other Ambulatory Visit: Payer: Self-pay

## 2019-01-03 ENCOUNTER — Other Ambulatory Visit (INDEPENDENT_AMBULATORY_CARE_PROVIDER_SITE_OTHER): Payer: 59

## 2019-01-03 ENCOUNTER — Encounter: Payer: Self-pay | Admitting: Internal Medicine

## 2019-01-03 VITALS — BP 128/76 | HR 58 | Temp 97.8°F | Ht 65.0 in | Wt 184.0 lb

## 2019-01-03 DIAGNOSIS — Z23 Encounter for immunization: Secondary | ICD-10-CM

## 2019-01-03 DIAGNOSIS — Z0000 Encounter for general adult medical examination without abnormal findings: Secondary | ICD-10-CM | POA: Diagnosis not present

## 2019-01-03 DIAGNOSIS — R635 Abnormal weight gain: Secondary | ICD-10-CM | POA: Diagnosis not present

## 2019-01-03 DIAGNOSIS — E785 Hyperlipidemia, unspecified: Secondary | ICD-10-CM

## 2019-01-03 LAB — HEPATIC FUNCTION PANEL
ALT: 18 U/L (ref 0–35)
AST: 16 U/L (ref 0–37)
Albumin: 4.6 g/dL (ref 3.5–5.2)
Alkaline Phosphatase: 103 U/L (ref 39–117)
Bilirubin, Direct: 0.1 mg/dL (ref 0.0–0.3)
Total Bilirubin: 0.7 mg/dL (ref 0.2–1.2)
Total Protein: 7.1 g/dL (ref 6.0–8.3)

## 2019-01-03 LAB — LIPID PANEL
Cholesterol: 176 mg/dL (ref 0–200)
HDL: 59.2 mg/dL (ref >39.00)
LDL Cholesterol: 93 mg/dL (ref 0–99)
NonHDL: 116.96
Total CHOL/HDL Ratio: 3
Triglycerides: 118 mg/dL (ref 0.0–149.0)
VLDL: 23.6 mg/dL (ref 0.0–40.0)

## 2019-01-03 LAB — URINALYSIS, ROUTINE W REFLEX MICROSCOPIC
Bilirubin Urine: NEGATIVE
Ketones, ur: NEGATIVE
Nitrite: NEGATIVE
Specific Gravity, Urine: 1.01 (ref 1.000–1.030)
Total Protein, Urine: NEGATIVE
Urine Glucose: NEGATIVE
Urobilinogen, UA: 0.2 (ref 0.0–1.0)
pH: 6 (ref 5.0–8.0)

## 2019-01-03 LAB — TSH: TSH: 3.6 u[IU]/mL (ref 0.35–4.50)

## 2019-01-03 LAB — CBC WITH DIFFERENTIAL/PLATELET
Basophils Absolute: 0.1 10*3/uL (ref 0.0–0.1)
Basophils Relative: 1.4 % (ref 0.0–3.0)
Eosinophils Absolute: 0.2 10*3/uL (ref 0.0–0.7)
Eosinophils Relative: 3.9 % (ref 0.0–5.0)
HCT: 42.4 % (ref 36.0–46.0)
Hemoglobin: 14.6 g/dL (ref 12.0–15.0)
Lymphocytes Relative: 38.2 % (ref 12.0–46.0)
Lymphs Abs: 1.7 10*3/uL (ref 0.7–4.0)
MCHC: 34.3 g/dL (ref 30.0–36.0)
MCV: 89.8 fl (ref 78.0–100.0)
Monocytes Absolute: 0.4 10*3/uL (ref 0.1–1.0)
Monocytes Relative: 8.9 % (ref 3.0–12.0)
Neutro Abs: 2.1 10*3/uL (ref 1.4–7.7)
Neutrophils Relative %: 47.6 % (ref 43.0–77.0)
Platelets: 343 10*3/uL (ref 150.0–400.0)
RBC: 4.72 Mil/uL (ref 3.87–5.11)
RDW: 13.1 % (ref 11.5–15.5)
WBC: 4.4 10*3/uL (ref 4.0–10.5)

## 2019-01-03 LAB — BASIC METABOLIC PANEL
BUN: 21 mg/dL (ref 6–23)
CO2: 28 mEq/L (ref 19–32)
Calcium: 9.6 mg/dL (ref 8.4–10.5)
Chloride: 106 mEq/L (ref 96–112)
Creatinine, Ser: 0.82 mg/dL (ref 0.40–1.20)
GFR: 69.7 mL/min (ref >60.00)
Glucose, Bld: 107 mg/dL — ABNORMAL HIGH (ref 70–99)
Potassium: 4.2 mEq/L (ref 3.5–5.1)
Sodium: 140 mEq/L (ref 135–145)

## 2019-01-03 MED ORDER — ROSUVASTATIN CALCIUM 10 MG PO TABS: 10.0000 mg | ORAL_TABLET | Freq: Every day | ORAL | 3 refills | Status: DC

## 2019-01-03 NOTE — Assessment & Plan Note (Signed)
-   Crestor 

## 2019-01-03 NOTE — Progress Notes (Signed)
Subjective:  Patient ID: Kristy Singh, female    DOB: [birth year]  Age: 68 y.o. MRN: [id number]  CC: No chief complaint on file.   HPI Kristy Singh presents for a well exam  Outpatient Medications Prior to Visit  Medication Sig Dispense Refill  . anastrozole (ARIMIDEX) 1 MG tablet Take 1 tablet (1 mg total) by mouth daily. 90 tablet 3  . Cholecalciferol (VITAMIN D3) 1000 UNITS tablet Take 1,000 Units by mouth daily.      Kristy Singh Calcium (STOOL SOFTENER PO) Take 1 tablet by mouth every other day. Rexall stool softener    . rosuvastatin (CRESTOR) 10 MG tablet TAKE 1 TABLET DAILY 90 tablet 2  . Turmeric 500 MG CAPS Take 500 mg by mouth daily.     No facility-administered medications prior to visit.     ROS: Review of Systems  Constitutional: Negative for activity change, appetite change, chills, diaphoresis, fatigue, fever and unexpected weight change.  HENT: Negative for congestion, dental problem, ear pain, hearing loss, mouth sores, postnasal drip, sinus pressure, sneezing, sore throat and voice change.   Eyes: Negative for pain and visual disturbance.  Respiratory: Negative for cough, chest tightness, wheezing and stridor.   Cardiovascular: Negative for chest pain, palpitations and leg swelling.  Gastrointestinal: Negative for abdominal distention, abdominal pain, blood in stool, nausea, rectal pain and vomiting.  Genitourinary: Negative for decreased urine volume, difficulty urinating, dysuria, frequency, hematuria, menstrual problem, vaginal bleeding, vaginal discharge and vaginal pain.  Musculoskeletal: Positive for arthralgias. Negative for back pain, gait problem, joint swelling and neck pain.  Skin: Negative for color change, rash and wound.  Neurological: Negative for dizziness, tremors, syncope, speech difficulty, weakness and light-headedness.  Hematological: Negative for adenopathy.  Psychiatric/Behavioral: Negative for behavioral problems, confusion,  decreased concentration, dysphoric mood, hallucinations, sleep disturbance and suicidal ideas. The patient is not nervous/anxious and is not hyperactive.     Objective:  BP 128/76 (BP Location: Left Arm, Patient Position: Sitting, Cuff Size: Normal)   Pulse (!) 58   Temp 97.8 F (36.6 C) (Oral)   Ht 5\' 5"  (1.651 m)   Wt 184 lb (83.5 kg)   SpO2 97%   BMI 30.62 kg/m   BP Readings from Last 3 Encounters:  01/03/19 128/76  12/15/17 122/76  10/05/17 122/71    Wt Readings from Last 3 Encounters:  01/03/19 184 lb (83.5 kg)  12/15/17 190 lb (86.2 kg)  10/05/17 186 lb 14.4 oz (84.8 kg)    Physical Exam Constitutional:      General: She is not in acute distress.    Appearance: She is well-developed.  HENT:     Head: Normocephalic.     Right Ear: External ear normal.     Left Ear: External ear normal.     Nose: Nose normal.  Eyes:     General:        Right eye: No discharge.        Left eye: No discharge.     Conjunctiva/sclera: Conjunctivae normal.     Pupils: Pupils are equal, round, and reactive to light.  Neck:     Musculoskeletal: Normal range of motion and neck supple.     Thyroid: No thyromegaly.     Vascular: No JVD.     Trachea: No tracheal deviation.  Cardiovascular:     Rate and Rhythm: Normal rate and regular rhythm.     Heart sounds: Normal heart sounds.  Pulmonary:     Effort: No respiratory  distress.     Breath sounds: No stridor. No wheezing.  Abdominal:     General: Bowel sounds are normal. There is no distension.     Palpations: Abdomen is soft. There is no mass.     Tenderness: There is no abdominal tenderness. There is no guarding or rebound.  Musculoskeletal:        General: No tenderness.  Lymphadenopathy:     Cervical: No cervical adenopathy.  Skin:    Findings: No erythema or rash.  Neurological:     Mental Status: She is oriented to person, place, and time.     Cranial Nerves: No cranial nerve deficit.     Motor: No abnormal muscle tone.      Coordination: Coordination normal.     Deep Tendon Reflexes: Reflexes normal.  Psychiatric:        Behavior: Behavior normal.        Thought Content: Thought content normal.        Judgment: Judgment normal.     Lab Results  Component Value Date   WBC 4.3 12/15/2017   HGB 14.7 12/15/2017   HCT 43.1 12/15/2017   PLT 333.0 12/15/2017   GLUCOSE 94 12/15/2017   CHOL 176 12/15/2017   TRIG 152.0 (H) 12/15/2017   HDL 54.30 12/15/2017   LDLDIRECT 189.4 08/29/2010   LDLCALC 91 12/15/2017   ALT 17 12/15/2017   AST 13 12/15/2017   NA 141 12/15/2017   K 3.9 12/15/2017   CL 108 12/15/2017   CREATININE 0.83 12/15/2017   BUN 21 12/15/2017   CO2 25 12/15/2017   TSH 3.81 12/15/2017    Mm Diag Breast Tomo Bilateral  Result Date: 07/14/2018 CLINICAL DATA:  68 year old patient with history of right breast cancer diagnosed May of 2017.She presents for routine annual exam. EXAM: DIGITAL DIAGNOSTIC BILATERAL MAMMOGRAM WITH CAD AND TOMO COMPARISON:  Previous exam(s). ACR Breast Density Category b: There are scattered areas of fibroglandular density. FINDINGS: Lumpectomy changes in the upper central right breast. No mass, nonsurgical distortion, or suspicious microcalcification is identified in either breast to suggest malignancy. Mammographic images were processed with CAD. IMPRESSION: No evidence of malignancy in either breast. Lumpectomy changes on the right. RECOMMENDATION: Diagnostic mammogram is suggested in 1 year. (Code:[id number]-B-01Y) I have discussed the findings and recommendations with the patient. Results were also provided in writing at the conclusion of the visit. If applicable, a reminder letter will be sent to the patient regarding the next appointment. BI-RADS CATEGORY  2: Benign. Electronically Signed   By: Curlene Dolphin M.D.   On: 07/14/2018 16:17    Assessment & Plan:   There are no diagnoses linked to this encounter.   No orders of the defined types were placed in this encounter.     Follow-up: No follow-ups on file.  Walker Kehr, MD

## 2019-01-03 NOTE — Addendum Note (Signed)
Addended by: Earnstine Regal on: 01/03/2019 09:20 AM   Modules accepted: Orders

## 2019-01-03 NOTE — Assessment & Plan Note (Signed)
Wt Readings from Last 3 Encounters:  01/03/19 184 lb (83.5 kg)  12/15/17 190 lb (86.2 kg)  10/05/17 186 lb 14.4 oz (84.8 kg)

## 2019-01-03 NOTE — Assessment & Plan Note (Signed)
°

## 2019-01-03 NOTE — Patient Instructions (Addendum)
These suggestions will probably help you to improve your metabolism if you are not overweight and to lose weight if you are overweight: 1.  Reduce your consumption of sugars and starches.  Eliminate high fructose corn syrup from your diet.  Reduce your consumption of processed foods.  For desserts try to have seasonal fruits, berries, nuts, cheeses or dark chocolate with more than 70% cacao. 2.  Do not snack 3.  You do not have to eat breakfast.  If you choose to have breakfast-eat plain greek yogurt, eggs, oatmeal (without sugar) 4.  Drink water, freshly brewed unsweetened tea (green, black or herbal) or coffee.  Do not drink sodas including diet sodas , juices, beverages sweetened with artificial sweeteners. 5.  Reduce your consumption of refined grains. 6.  Avoid protein drinks such as Optifast, Slim fast etc. Eat chicken, fish, meat, dairy and beans for your sources of protein 7.  Natural unprocessed fats like cold pressed virgin olive oil, butter, coconut oil are good for you.  Eat avocados 8.  Increase your consumption of fiber.  Fruits, berries, vegetables, whole grains, flaxseeds, Chia seeds, beans, popcorn, nuts, oatmeal are good sources of fiber 9.  Use vinegar in your diet, i.e. apple cider vinegar, red wine or balsamic vinegar 10.  You can try fasting.  For example you can skip breakfast and lunch every other day (24-hour fast) 11.  Stress reduction, good night sleep, relaxation, meditation, yoga and other physical activity is likely to help you to maintain low weight too. 12.  If you drink alcohol, limit your alcohol intake to no more than 2 drinks a day.  Cabbage soup recipe that will not make you gain weight: Take 1 small head of cabbage, 1 average pack of celery, 4 green peppers, 4 onions, 2 cans diced tomatoes (they are not available without salt), salt and spices to taste.  Chop cabbage, celery, peppers and onions.  And tomatoes and 2-2.5 liters (2.5 quarts) of water so that it  would just cover the vegetables.  Bring to boil.  Add spices and salt.  Turn heat to low/medium and simmer for 20-25 minutes.  Naturally, you can make a smaller batch and change some of the ingredients.      Cardiac CT calcium scoring test $150 Tel # is [phone]   Computed tomography, more commonly known as a CT or CAT scan, is a diagnostic medical imaging test. Like traditional x-rays, it produces multiple images or pictures of the inside of the body. The cross-sectional images generated during a CT scan can be reformatted in multiple planes. They can even generate three-dimensional images. These images can be viewed on a computer monitor, printed on film or by a 3D printer, or transferred to a CD or DVD. CT images of internal organs, bones, soft tissue and blood vessels provide greater detail than traditional x-rays, particularly of soft tissues and blood vessels. A cardiac CT scan for coronary calcium is a non-invasive way of obtaining information about the presence, location and extent of calcified plaque in the coronary arteries-the vessels that supply oxygen-containing blood to the heart muscle. Calcified plaque results when there is a build-up of fat and other substances under the inner layer of the artery. This material can calcify which signals the presence of atherosclerosis, a disease of the vessel wall, also called coronary artery disease (CAD). People with this disease have an increased risk for heart attacks. In addition, over time, progression of plaque build up (CAD) can narrow the arteries  or even close off blood flow to the heart. The result may be chest pain, sometimes called "angina," or a heart attack. Because calcium is a marker of CAD, the amount of calcium detected on a cardiac CT scan is a helpful prognostic tool. The findings on cardiac CT are expressed as a calcium score. Another name for this test is coronary artery calcium scoring.  What are some common uses of the  procedure? The goal of cardiac CT scan for calcium scoring is to determine if CAD is present and to what extent, even if there are no symptoms. It is a screening study that may be recommended by a physician for patients with risk factors for CAD but no clinical symptoms. The major risk factors for CAD are: . high blood cholesterol levels  . family history of heart attacks  . diabetes  . high blood pressure  . cigarette smoking  . overweight or obese  . physical inactivity   A negative cardiac CT scan for calcium scoring shows no calcification within the coronary arteries. This suggests that CAD is absent or so minimal it cannot be seen by this technique. The chance of having a heart attack over the next two to five years is very low under these circumstances. A positive test means that CAD is present, regardless of whether or not the patient is experiencing any symptoms. The amount of calcification-expressed as the calcium score-may help to predict the likelihood of a myocardial infarction (heart attack) in the coming years and helps your medical doctor or cardiologist decide whether the patient may need to take preventive medicine or undertake other measures such as diet and exercise to lower the risk for heart attack. The extent of CAD is graded according to your calcium score:  Calcium Score  Presence of CAD (coronary artery disease)  0 No evidence of CAD   1-10 Minimal evidence of CAD  11-100 Mild evidence of CAD  101-400 Moderate evidence of CAD  Over 400 Extensive evidence of CAD

## 2019-01-04 ENCOUNTER — Other Ambulatory Visit: Payer: Self-pay | Admitting: Internal Medicine

## 2019-01-04 MED ORDER — CEFUROXIME AXETIL 250 MG PO TABS: 250.0000 mg | ORAL_TABLET | Freq: Two times a day (BID) | ORAL | 0 refills | Status: DC

## 2019-01-23 ENCOUNTER — Encounter: Payer: Self-pay | Admitting: Internal Medicine

## 2019-02-28 ENCOUNTER — Ambulatory Visit (INDEPENDENT_AMBULATORY_CARE_PROVIDER_SITE_OTHER)
Admission: RE | Admit: 2019-02-28 | Discharge: 2019-02-28 | Disposition: A | Discharge status: Home / Self Care | Payer: Self-pay | Source: Ambulatory Visit | Attending: Internal Medicine | Admitting: Internal Medicine

## 2019-02-28 ENCOUNTER — Other Ambulatory Visit: Payer: Self-pay

## 2019-02-28 DIAGNOSIS — E785 Hyperlipidemia, unspecified: Secondary | ICD-10-CM

## 2019-03-03 ENCOUNTER — Encounter: Payer: Self-pay | Admitting: Internal Medicine

## 2019-03-03 DIAGNOSIS — I2510 Atherosclerotic heart disease of native coronary artery without angina pectoris: Secondary | ICD-10-CM | POA: Insufficient documentation

## 2019-06-14 ENCOUNTER — Other Ambulatory Visit: Payer: Self-pay | Admitting: Hematology and Oncology

## 2019-06-14 DIAGNOSIS — Z853 Personal history of malignant neoplasm of breast: Secondary | ICD-10-CM

## 2019-06-14 DIAGNOSIS — Z9889 Other specified postprocedural states: Secondary | ICD-10-CM

## 2019-07-18 ENCOUNTER — Other Ambulatory Visit: Payer: Self-pay

## 2019-07-18 ENCOUNTER — Ambulatory Visit
Admission: RE | Admit: 2019-07-18 | Discharge: 2019-07-18 | Disposition: A | Discharge status: Home / Self Care | Payer: 59 | Source: Ambulatory Visit | Attending: Hematology and Oncology | Admitting: Hematology and Oncology

## 2019-07-18 DIAGNOSIS — Z853 Personal history of malignant neoplasm of breast: Secondary | ICD-10-CM

## 2019-07-18 DIAGNOSIS — Z9889 Other specified postprocedural states: Secondary | ICD-10-CM

## 2019-08-11 ENCOUNTER — Other Ambulatory Visit: Payer: Self-pay | Admitting: Hematology and Oncology

## 2019-08-11 DIAGNOSIS — M8588 Other specified disorders of bone density and structure, other site: Secondary | ICD-10-CM

## 2019-10-03 NOTE — Progress Notes (Signed)
Patient Care Team: Plotnikov, Evie Lacks, MD as PCP - General [name], MD as Consulting Physician (Obstetrics and Gynecology) Nicholas Lose, MD as Consulting Physician (Hematology and Oncology) Jovita Kussmaul, MD as Consulting Physician (General Surgery) Kyung Rudd, MD as Consulting Physician (Radiation Oncology) Gatha Mayer, MD as Consulting Physician (Gastroenterology)  DIAGNOSIS:    ICD-10-CM   1. Malignant neoplasm of upper-inner quadrant of right breast in female, estrogen receptor positive ([place])  C50.211    Z17.0     SUMMARY OF ONCOLOGIC HISTORY: Oncology History  Breast cancer of upper-inner quadrant of right female breast ([place])  07/04/2015 Mammogram   Right breast mass 7 mm 12:30 position   07/05/2015 Initial Diagnosis   Right breast biopsy 12:30 position: Invasive ductal carcinoma grade 2, ER 100%, PR 95%, HER-2 negative ratio 1.29, K 67 15%, T1 BN 0 stage IA   08/02/2015 Surgery   Right lumpectomy [name]): Invasive ductal carcinoma grade 2, 0.7 cm, margins negative, 0/3 sentinel nodes, ER 100%, PR 95%, HER-2 negative ratio 1.17, Ki-67 15%, T1 BN 0 stage IA Oncotype DX score 21, 13% ROR   09/16/2015 - 10/11/2015 Radiation Therapy   Radiation [name]) to right breast: 42.5Gy in 17 fractions, right breast boost: 7.5 Gy in 3 fractions.     12/09/2015 -  Anti-estrogen oral therapy   Anastrozole 1 mg by mouth daily     CHIEF COMPLIANT: Follow-up of right breast cancer on anastrozole therapy  INTERVAL HISTORY: Kristy Singh is a 69 y.o. with above-mentioned history of right breast cancer who underwent a lumpectomy, radiation, and is currently on anastrozole therapy. Mammogram on 07/18/19 revealed no evidence of malignancy bilaterally. She presents to the clinic today for follow-up.  She is tolerating anastrozole extremely well without any problems or concerns.  ALLERGIES:  has No Known Allergies.  MEDICATIONS:  Current Outpatient Medications  Medication Sig  Dispense Refill  . anastrozole (ARIMIDEX) 1 MG tablet Take 1 tablet (1 mg total) by mouth daily. 90 tablet 3  . cefUROXime (CEFTIN) 250 MG tablet Take 1 tablet (250 mg total) by mouth 2 (two) times daily. 10 tablet 0  . Cholecalciferol (VITAMIN D3) 1000 UNITS tablet Take 1,000 Units by mouth daily.      [name] Calcium (STOOL SOFTENER PO) Take 1 tablet by mouth every other day. Rexall stool softener    . rosuvastatin (CRESTOR) 10 MG tablet Take 1 tablet (10 mg total) by mouth daily. 90 tablet 3  . Turmeric 500 MG CAPS Take 500 mg by mouth daily.     No current facility-administered medications for this visit.    PHYSICAL EXAMINATION: ECOG PERFORMANCE STATUS: 0 - Asymptomatic  Vitals:   10/04/19 0844  BP: (!) 117/96  Pulse: 65  Resp: 17  Temp: (!) 97.4 F (36.3 C)  SpO2: 100%   Filed Weights   10/04/19 0844  Weight: 186 lb 3.2 oz (84.5 kg)    BREAST: No palpable masses or nodules in either right or left breasts. No palpable axillary supraclavicular or infraclavicular adenopathy no breast tenderness or nipple discharge. (exam performed in the presence of a chaperone)  LABORATORY DATA:  I have reviewed the data as listed CMP Latest Ref Rng & Units 01/03/2019 12/15/2017 12/04/2016  Glucose 70 - 99 mg/dL 107(H) 94 98  BUN 6 - 23 mg/dL 21 21 18   Creatinine 0.40 - 1.20 mg/dL 0.82 0.83 0.74  Sodium 135 - 145 mEq/L 140 141 143  Potassium 3.5 - 5.1 mEq/L 4.2  3.9 4.6  Chloride 96 - 112 mEq/L 106 108 109  CO2 19 - 32 mEq/L 28 25 20   Calcium 8.4 - 10.5 mg/dL 9.6 9.7 9.6  Total Protein 6.0 - 8.3 g/dL 7.1 7.1 7.1  Total Bilirubin 0.2 - 1.2 mg/dL 0.7 0.6 0.6  Alkaline Phos 39 - 117 U/L 103 88 90  AST 0 - 37 U/L 16 13 18   ALT 0 - 35 U/L 18 17 16     Lab Results  Component Value Date   WBC 4.4 01/03/2019   HGB 14.6 01/03/2019   HCT 42.4 01/03/2019   MCV 89.8 01/03/2019   PLT 343.0 01/03/2019   NEUTROABS 2.1 01/03/2019    ASSESSMENT & PLAN:  Breast cancer of upper-inner  quadrant of right female breast ([place]) Right lumpectomy 08/02/2015: Invasive ductal carcinoma grade 2, 0.7 cm, margins negative, 0/3 sentinel nodes, ER 100%, PR 95%, HER-2 negative ratio 1.29, Ki-67 15%, T1 BN 0 stage IA Oncotype DX score 21, 13% ROR, did not require chemotherapy, intermediate risk Adjuvant radiation therapy from 09/16/2015 to 10/11/2015  Treatment plan: Adjuvant antiestrogen therapy with anastrozole 1 mg by mouth daily 5-7years started 12/09/2015  Anastrozole toxicities: Occasional hot flashes  Breast cancer surveillance: mammogram5/25/2021: Benign breast density category B Breast exam 10/04/2019: Benign Changed diet (less carbs and eating more fruits ane veg)  Return to clinic in 1 year for follow-up    No orders of the defined types were placed in this encounter.  The patient has a good understanding of the overall plan. she agrees with it. she will call with any problems that may develop before the next visit here.  Total time spent: 20 mins including face to face time and time spent for planning, charting and coordination of care  Nicholas Lose, MD 10/04/2019  I, Cloyde Reams Dorshimer, am acting as scribe for Dr. Nicholas Lose.  I have reviewed the above documentation for accuracy and completeness, and I agree with the above.

## 2019-10-04 ENCOUNTER — Inpatient Hospital Stay: Payer: 59 | Attending: Hematology and Oncology | Admitting: Hematology and Oncology

## 2019-10-04 ENCOUNTER — Other Ambulatory Visit: Payer: Self-pay

## 2019-10-04 DIAGNOSIS — Z170 Estrogen receptor positive status [ER+]: Secondary | ICD-10-CM | POA: Diagnosis not present

## 2019-10-04 DIAGNOSIS — Z79811 Long term (current) use of aromatase inhibitors: Secondary | ICD-10-CM | POA: Insufficient documentation

## 2019-10-04 DIAGNOSIS — Z79899 Other long term (current) drug therapy: Secondary | ICD-10-CM | POA: Diagnosis not present

## 2019-10-04 DIAGNOSIS — Z923 Personal history of irradiation: Secondary | ICD-10-CM | POA: Insufficient documentation

## 2019-10-04 DIAGNOSIS — C50211 Malignant neoplasm of upper-inner quadrant of right female breast: Secondary | ICD-10-CM | POA: Diagnosis not present

## 2019-10-04 MED ORDER — ANASTROZOLE 1 MG PO TABS: 1.0000 mg | ORAL_TABLET | Freq: Every day | ORAL | 3 refills | Status: DC

## 2019-10-04 NOTE — Assessment & Plan Note (Signed)
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 Patient has had chronic mild hot flashes which are unchanged  Breast cancer surveillance: mammogram5/25/2021: Benign breast density category B Breast exam 10/04/2019: Benign Changed diet (less carbs and eating more fruits ane veg)  Return to clinic in 1 year for follow-up

## 2019-10-25 ENCOUNTER — Other Ambulatory Visit: Payer: Self-pay

## 2019-10-25 ENCOUNTER — Ambulatory Visit
Admission: RE | Admit: 2019-10-25 | Discharge: 2019-10-25 | Disposition: A | Discharge status: Home / Self Care | Payer: 59 | Source: Ambulatory Visit | Attending: Hematology and Oncology | Admitting: Hematology and Oncology

## 2019-10-25 DIAGNOSIS — M8588 Other specified disorders of bone density and structure, other site: Secondary | ICD-10-CM

## 2020-01-04 ENCOUNTER — Encounter: Payer: 59 | Admitting: Internal Medicine

## 2020-02-05 ENCOUNTER — Other Ambulatory Visit: Payer: Self-pay

## 2020-02-06 ENCOUNTER — Ambulatory Visit (INDEPENDENT_AMBULATORY_CARE_PROVIDER_SITE_OTHER): Payer: 59 | Admitting: Internal Medicine

## 2020-02-06 ENCOUNTER — Encounter: Payer: Self-pay | Admitting: Internal Medicine

## 2020-02-06 DIAGNOSIS — Z0000 Encounter for general adult medical examination without abnormal findings: Secondary | ICD-10-CM | POA: Diagnosis not present

## 2020-02-06 LAB — CBC WITH DIFFERENTIAL/PLATELET
Basophils Absolute: 0 10*3/uL (ref 0.0–0.1)
Basophils Relative: 1 % (ref 0.0–3.0)
Eosinophils Absolute: 0.3 10*3/uL (ref 0.0–0.7)
Eosinophils Relative: 8 % — ABNORMAL HIGH (ref 0.0–5.0)
HCT: 41.4 % (ref 36.0–46.0)
Hemoglobin: 14.3 g/dL (ref 12.0–15.0)
Lymphocytes Relative: 43 % (ref 12.0–46.0)
Lymphs Abs: 1.7 10*3/uL (ref 0.7–4.0)
MCHC: 34.5 g/dL (ref 30.0–36.0)
MCV: 89.6 fl (ref 78.0–100.0)
Monocytes Absolute: 0.4 10*3/uL (ref 0.1–1.0)
Monocytes Relative: 9.2 % (ref 3.0–12.0)
Neutro Abs: 1.5 10*3/uL (ref 1.4–7.7)
Neutrophils Relative %: 38.8 % — ABNORMAL LOW (ref 43.0–77.0)
Platelets: 316 10*3/uL (ref 150.0–400.0)
RBC: 4.61 Mil/uL (ref 3.87–5.11)
RDW: 13 % (ref 11.5–15.5)
WBC: 4 10*3/uL (ref 4.0–10.5)

## 2020-02-06 LAB — URINALYSIS, ROUTINE W REFLEX MICROSCOPIC
Bilirubin Urine: NEGATIVE
Ketones, ur: NEGATIVE
Nitrite: NEGATIVE
Specific Gravity, Urine: <=1.005 — AB (ref 1.000–1.030)
Total Protein, Urine: NEGATIVE
Urine Glucose: NEGATIVE
Urobilinogen, UA: 0.2 (ref 0.0–1.0)
pH: 6.5 (ref 5.0–8.0)

## 2020-02-06 LAB — LIPID PANEL
Cholesterol: 159 mg/dL (ref 0–200)
HDL: 52.8 mg/dL (ref >39.00)
LDL Cholesterol: 80 mg/dL (ref 0–99)
NonHDL: 106.03
Total CHOL/HDL Ratio: 3
Triglycerides: 128 mg/dL (ref 0.0–149.0)
VLDL: 25.6 mg/dL (ref 0.0–40.0)

## 2020-02-06 LAB — COMPREHENSIVE METABOLIC PANEL
ALT: 17 U/L (ref 0–35)
AST: 16 U/L (ref 0–37)
Albumin: 4.3 g/dL (ref 3.5–5.2)
Alkaline Phosphatase: 94 U/L (ref 39–117)
BUN: 13 mg/dL (ref 6–23)
CO2: 28 mEq/L (ref 19–32)
Calcium: 9.6 mg/dL (ref 8.4–10.5)
Chloride: 107 mEq/L (ref 96–112)
Creatinine, Ser: 0.73 mg/dL (ref 0.40–1.20)
GFR: 85.17 mL/min (ref >60.00)
Glucose, Bld: 97 mg/dL (ref 70–99)
Potassium: 3.9 mEq/L (ref 3.5–5.1)
Sodium: 141 mEq/L (ref 135–145)
Total Bilirubin: 0.7 mg/dL (ref 0.2–1.2)
Total Protein: 7 g/dL (ref 6.0–8.3)

## 2020-02-06 LAB — TSH: TSH: 2.3 u[IU]/mL (ref 0.35–4.50)

## 2020-02-06 MED ORDER — ROSUVASTATIN CALCIUM 10 MG PO TABS: 10.0000 mg | ORAL_TABLET | Freq: Every day | ORAL | 3 refills | Status: DC

## 2020-02-06 NOTE — Assessment & Plan Note (Signed)
°

## 2020-02-06 NOTE — Progress Notes (Signed)
Subjective:  Patient ID: Kristy Singh, female    DOB: 17-May-1950  Age: 69 y.o. MRN: [id number]  CC: Annual Exam   HPI [name] presents for a well exam  Outpatient Medications Prior to Visit  Medication Sig Dispense Refill  . anastrozole (ARIMIDEX) 1 MG tablet Take 1 tablet (1 mg total) by mouth daily. 90 tablet 3  . Cholecalciferol (VITAMIN D3) 1000 UNITS tablet Take 1,000 Units by mouth daily.    [name] Calcium (STOOL SOFTENER PO) Take 1 tablet by mouth every other day. Rexall stool softener    . rosuvastatin (CRESTOR) 10 MG tablet Take 1 tablet (10 mg total) by mouth daily. 90 tablet 3  . Turmeric 500 MG CAPS Take 500 mg by mouth daily.     No facility-administered medications prior to visit.    ROS: Review of Systems  Constitutional: Negative for activity change, appetite change, chills, fatigue and unexpected weight change.  HENT: Negative for congestion, mouth sores and sinus pressure.   Eyes: Negative for visual disturbance.  Respiratory: Negative for cough and chest tightness.   Gastrointestinal: Negative for abdominal pain and nausea.  Genitourinary: Negative for difficulty urinating, frequency and vaginal pain.  Musculoskeletal: Negative for back pain and gait problem.  Skin: Negative for pallor and rash.  Neurological: Negative for dizziness, tremors, weakness, numbness and headaches.  Psychiatric/Behavioral: Negative for confusion and sleep disturbance. The patient is not nervous/anxious.     Objective:  BP 112/72 (BP Location: Left Arm)   Pulse (!) 53   Temp 98 F (36.7 C) (Oral)   Ht 5\' 5"  (1.651 m)   Wt 177 lb 3.2 oz (80.4 kg)   SpO2 98%   BMI 29.49 kg/m   BP Readings from Last 3 Encounters:  02/06/20 112/72  10/04/19 (!) 117/96  01/03/19 128/76    Wt Readings from Last 3 Encounters:  02/06/20 177 lb 3.2 oz (80.4 kg)  10/04/19 186 lb 3.2 oz (84.5 kg)  01/03/19 184 lb (83.5 kg)    Physical Exam Constitutional:       General: She is not in acute distress.    Appearance: She is well-developed.  HENT:     Head: Normocephalic.     Right Ear: External ear normal.     Left Ear: External ear normal.     Nose: Nose normal.     Mouth/Throat:     Mouth: Oropharynx is clear and moist.  Eyes:     General:        Right eye: No discharge.        Left eye: No discharge.     Conjunctiva/sclera: Conjunctivae normal.     Pupils: Pupils are equal, round, and reactive to light.  Neck:     Thyroid: No thyromegaly.     Vascular: No JVD.     Trachea: No tracheal deviation.  Cardiovascular:     Rate and Rhythm: Normal rate and regular rhythm.     Heart sounds: Normal heart sounds.  Pulmonary:     Effort: No respiratory distress.     Breath sounds: No stridor. No wheezing.  Abdominal:     General: Bowel sounds are normal. There is no distension.     Palpations: Abdomen is soft. There is no mass.     Tenderness: There is no abdominal tenderness. There is no guarding or rebound.  Musculoskeletal:        General: No tenderness or edema.     Cervical back: Normal range  of motion and neck supple.  Lymphadenopathy:     Cervical: No cervical adenopathy.  Skin:    Findings: No erythema or rash.  Neurological:     Cranial Nerves: No cranial nerve deficit.     Motor: No abnormal muscle tone.     Coordination: Coordination normal.     Deep Tendon Reflexes: Reflexes normal.  Psychiatric:        Mood and Affect: Mood and affect normal.        Behavior: Behavior normal.        Thought Content: Thought content normal.        Judgment: Judgment normal.     Lab Results  Component Value Date   WBC 4.4 01/03/2019   HGB 14.6 01/03/2019   HCT 42.4 01/03/2019   PLT 343.0 01/03/2019   GLUCOSE 107 (H) 01/03/2019   CHOL 176 01/03/2019   TRIG 118.0 01/03/2019   HDL 59.20 01/03/2019   LDLDIRECT 189.4 08/29/2010   LDLCALC 93 01/03/2019   ALT 18 01/03/2019   AST 16 01/03/2019   NA 140 01/03/2019   K 4.2 01/03/2019    CL 106 01/03/2019   CREATININE 0.82 01/03/2019   BUN 21 01/03/2019   CO2 28 01/03/2019   TSH 3.60 01/03/2019    DG BONE DENSITY (DXA)  Result Date: 10/26/2019 EXAM: DUAL X-RAY ABSORPTIOMETRY (DXA) FOR BONE MINERAL DENSITY IMPRESSION: Referring Physician:  Nicholas Lose Your patient completed a BMD test using Lunar IDXA DXA system ( analysis version: 16 ) manufactured by [employer]. Technologist: AW PATIENT: Name: Kristy, Singh Patient ID: [id number] Birth Date: September 24, 1950 Height: 64.5 in. Sex: Female Measured: 10/25/2019 Weight: 181.8 lbs. Indications: Arimidex, Breast Cancer History, Caucasian, Estrogen Deficient, Hysterectomy, Low Calcium Intake (269.3), Omeprazole, One ovary removed, Postmenopausal, Secondary Osteoporosis Fractures: None Treatments: Calcium (E943.0), Vitamin D (E933.5) ASSESSMENT: The BMD measured at AP Spine L1-L3 is 0.988 g/cm2 with a T-score of -1.5. This patient is considered OSTEOPENIC penic according to [facility]) criteria. The scan quality is good. L-4 was excluded due to degenerative changes. Site Region Measured Date Measured Age YA T-score BMD Significant CHANGE AP Spine L1-L3 10/25/2019 69.0 -1.5 0.988 g/cm2 * AP Spine  L1-L3      10/12/2017    64.9         -1.8    0.959 g/cm2 DualFemur Neck Right 10/25/2019    67.0         -0.9    0.915 g/cm2 DualFemur Neck Right 10/12/2017    64.9         -0.9    0.906 g/cm2 DualFemur Total Mean 10/25/2019 67.0 -0.4 0.953 g/cm2 * DualFemur Total Mean 10/12/2017    64.9         -0.1    0.995 g/cm2 World Health Organization [facility]) criteria for post-menopausal, Caucasian Women: Normal       T-score at or above -1 SD Osteopenia   T-score between -1 and -2.5 SD Osteoporosis T-score at or below -2.5 SD RECOMMENDATION: 1. All patients should optimize calcium and vitamin D intake. 2. Consider FDA approved medical therapies in postmenopausal women and men aged 81 years and older, based on the following: a. A hip or vertebral  (clinical or morphometric) fracture b. T-score < or = -2.5 at the femoral neck or spine after appropriate evaluation to exclude secondary causes c. Low bone mass (T-score between -1.0 and -2.5 at the femoral neck or spine) and a 10 year probability of a hip fracture >  or = 3% or a 10 year probability of a major osteoporosis-related fracture > or = 20% based on the [place]-adapted WHO algorithm d. Clinician judgment and/or patient preferences may indicate treatment for people with 10-year fracture probabilities above or below these levels FOLLOW-UP: People with diagnosed cases of osteoporosis or at high risk for fracture should have regular bone mineral density tests. For patients eligible for Medicare, routine testing is allowed once every 2 years. The testing frequency can be increased to one year for patients who have rapidly progressing disease, those who are receiving or discontinuing medical therapy to restore bone mass, or have additional risk factors. I have reviewed this report and agree with the above findings. Mark A. Thornton Papas, M.D. [facility] FRAX* 10-year Probability of Fracture Based on femoral neck BMD: DualFemur (Right) Major Osteoporotic Fracture: 7.9% Hip Fracture:                0.6% Population:                  [place] (Caucasian) Risk Factors:                Secondary Osteoporosis *FRAX is a Materials engineer of the [employer] for Metabolic Bone Disease, a [place] (WHO) [facility]. ASSESSMENT: The probability of a major osteoporotic fracture is 7.9 % within the next ten years. The probability of a hip fracture is 0.6 % within the next ten years. I have reviewed this report and agree with the above findings. Mark A. Thornton Papas, M.D. [facility] Electronically Signed   By: Lavonia Dana M.D.   On: 10/26/2019 08:06    Assessment & Plan:    Walker Kehr, MD

## 2020-02-06 NOTE — Addendum Note (Signed)
Addended by: Adah Salvage F on: 02/06/2020 08:42 AM   Modules accepted: Orders

## 2020-06-10 ENCOUNTER — Other Ambulatory Visit: Payer: Self-pay | Admitting: Hematology and Oncology

## 2020-06-10 DIAGNOSIS — Z1231 Encounter for screening mammogram for malignant neoplasm of breast: Secondary | ICD-10-CM

## 2020-07-30 ENCOUNTER — Other Ambulatory Visit: Payer: Self-pay

## 2020-07-30 ENCOUNTER — Ambulatory Visit
Admission: RE | Admit: 2020-07-30 | Discharge: 2020-07-30 | Disposition: A | Discharge status: Home / Self Care | Payer: 59 | Source: Ambulatory Visit | Attending: Hematology and Oncology | Admitting: Hematology and Oncology

## 2020-07-30 DIAGNOSIS — Z1231 Encounter for screening mammogram for malignant neoplasm of breast: Secondary | ICD-10-CM

## 2020-10-02 NOTE — Progress Notes (Signed)
 Patient Care Team: Plotnikov, Aleksei V, MD as PCP - General Anderson, Mark E, MD as Consulting Physician (Obstetrics and Gynecology) Gudena, Vinay, MD as Consulting Physician (Hematology and Oncology) Toth, Paul III, MD as Consulting Physician (General Surgery) Moody, John, MD as Consulting Physician (Radiation Oncology) Gessner, Carl E, MD as Consulting Physician (Gastroenterology)  DIAGNOSIS:    ICD-10-CM   1. Malignant neoplasm of upper-inner quadrant of right breast in female, estrogen receptor positive (HCC)  C50.211    Z17.0       SUMMARY OF ONCOLOGIC HISTORY: Oncology History  Breast cancer of upper-inner quadrant of right female breast (HCC)  07/04/2015 Mammogram   Right breast mass 7 mm 12:30 position   07/05/2015 Initial Diagnosis   Right breast biopsy 12:30 position: Invasive ductal carcinoma grade 2, ER 100%, PR 95%, HER-2 negative ratio 1.29, K 67 15%, T1 BN 0 stage IA   08/02/2015 Surgery   Right lumpectomy (Toth): Invasive ductal carcinoma grade 2, 0.7 cm, margins negative, 0/3 sentinel nodes, ER 100%, PR 95%, HER-2 negative ratio 1.17, Ki-67 15%, T1 BN 0 stage IA Oncotype DX score 21, 13% ROR   09/16/2015 - 10/11/2015 Radiation Therapy   Radiation (Moody) to right breast: 42.5Gy in 17 fractions, right breast boost: 7.5 Gy in 3 fractions.     12/09/2015 -  Anti-estrogen oral therapy   Anastrozole 1 mg by mouth daily     CHIEF COMPLIANT: Follow-up of right breast cancer on anastrozole therapy  INTERVAL HISTORY: Kristy Singh is a 69 y.o. with above-mentioned history of right breast cancer who underwent a lumpectomy, radiation, and is currently on anastrozole therapy. Mammogram on 07/30/20 revealed no evidence of malignancy bilaterally. She presents to the clinic today for follow-up.   ALLERGIES:  has No Known Allergies.  MEDICATIONS:  Current Outpatient Medications  Medication Sig Dispense Refill   anastrozole (ARIMIDEX) 1 MG tablet Take 1 tablet (1 mg  total) by mouth daily. 90 tablet 3   Cholecalciferol (VITAMIN D3) 1000 UNITS tablet Take 1,000 Units by mouth daily.     Docusate Calcium (STOOL SOFTENER PO) Take 1 tablet by mouth every other day. Rexall stool softener     rosuvastatin (CRESTOR) 10 MG tablet Take 1 tablet (10 mg total) by mouth daily. 90 tablet 3   Turmeric 500 MG CAPS Take 500 mg by mouth daily.     No current facility-administered medications for this visit.    PHYSICAL EXAMINATION: ECOG PERFORMANCE STATUS: 1 - Symptomatic but completely ambulatory  Vitals:   10/03/20 0818  BP: (!) 121/58  Pulse: 67  Resp: 18  Temp: 97.7 F (36.5 C)  SpO2: 98%   Filed Weights   10/03/20 0818  Weight: 167 lb 1.6 oz (75.8 kg)    BREAST: No palpable masses or nodules in either right or left breasts. No palpable axillary supraclavicular or infraclavicular adenopathy no breast tenderness or nipple discharge. (exam performed in the presence of a chaperone)  LABORATORY DATA:  I have reviewed the data as listed CMP Latest Ref Rng & Units 02/06/2020 01/03/2019 12/15/2017  Glucose 70 - 99 mg/dL 97 107(H) 94  BUN 6 - 23 mg/dL 13 21 21  Creatinine 0.40 - 1.20 mg/dL 0.73 0.82 0.83  Sodium 135 - 145 mEq/L 141 140 141  Potassium 3.5 - 5.1 mEq/L 3.9 4.2 3.9  Chloride 96 - 112 mEq/L 107 106 108  CO2 19 - 32 mEq/L 28 28 25  Calcium 8.4 - 10.5 mg/dL 9.6 9.6 9.7  Total   Protein 6.0 - 8.3 g/dL 7.0 7.1 7.1  Total Bilirubin 0.2 - 1.2 mg/dL 0.7 0.7 0.6  Alkaline Phos 39 - 117 U/L 94 103 88  AST 0 - 37 U/L 16 16 13  ALT 0 - 35 U/L 17 18 17    Lab Results  Component Value Date   WBC 4.0 02/06/2020   HGB 14.3 02/06/2020   HCT 41.4 02/06/2020   MCV 89.6 02/06/2020   PLT 316.0 02/06/2020   NEUTROABS 1.5 02/06/2020    ASSESSMENT & PLAN:  Breast cancer of upper-inner quadrant of right female breast (HCC) Right lumpectomy 08/02/2015: Invasive ductal carcinoma grade 2, 0.7 cm, margins negative, 0/3 sentinel nodes, ER 100%, PR 95%, HER-2  negative ratio 1.29, Ki-67 15%, T1 BN 0 stage IA Oncotype DX score 21, 13% ROR, did not require chemotherapy, intermediate risk Adjuvant radiation therapy from 09/16/2015 to 10/11/2015   Treatment plan: Adjuvant antiestrogen therapy with anastrozole 1 mg by mouth daily 5-7 years started 12/09/2015   Anastrozole toxicities: Occasional hot flashes   Breast cancer surveillance:   mammogram 08/01/2020: Benign breast density category  B Breast exam 10/03/2020: Benign Changed diet (less carbs and eating more fruits ane veg)  Bone density 10/26/2019: T score -1.5: Osteopenia   Return to clinic in 1 year for follow-up    No orders of the defined types were placed in this encounter.  The patient has a good understanding of the overall plan. she agrees with it. she will call with any problems that may develop before the next visit here.  Total time spent: 20 mins including face to face time and time spent for planning, charting and coordination of care  Vinay K Gudena, MD, MPH 10/03/2020  I, Kirstyn Singh, am acting as scribe for Dr. Vinay Gudena.  I have reviewed the above documentation for accuracy and completeness, and I agree with the above.       

## 2020-10-03 ENCOUNTER — Inpatient Hospital Stay: Payer: Medicare Other | Attending: Hematology and Oncology | Admitting: Hematology and Oncology

## 2020-10-03 ENCOUNTER — Other Ambulatory Visit: Payer: Self-pay

## 2020-10-03 DIAGNOSIS — Z79811 Long term (current) use of aromatase inhibitors: Secondary | ICD-10-CM | POA: Diagnosis not present

## 2020-10-03 DIAGNOSIS — Z170 Estrogen receptor positive status [ER+]: Secondary | ICD-10-CM | POA: Diagnosis not present

## 2020-10-03 DIAGNOSIS — C50211 Malignant neoplasm of upper-inner quadrant of right female breast: Secondary | ICD-10-CM | POA: Insufficient documentation

## 2020-10-03 DIAGNOSIS — Z923 Personal history of irradiation: Secondary | ICD-10-CM | POA: Diagnosis not present

## 2020-10-03 DIAGNOSIS — Z79899 Other long term (current) drug therapy: Secondary | ICD-10-CM | POA: Diagnosis not present

## 2020-10-03 MED ORDER — ANASTROZOLE 1 MG PO TABS: 1.0000 mg | ORAL_TABLET | Freq: Every day | ORAL | 3 refills | Status: DC

## 2020-10-03 NOTE — Assessment & Plan Note (Signed)
Right lumpectomy 08/02/2015: Invasive ductal carcinoma grade 2, 0.7 cm, margins negative, 0/3 sentinel nodes, ER 100%, PR 95%, HER-2 negative ratio 1.29, Ki-67 15%, T1 BN 0 stage IA Oncotype DX score 21, 13% ROR, did not require chemotherapy, intermediate risk Adjuvant radiation therapy from 09/16/2015 to 10/11/2015  Treatment plan: Adjuvant antiestrogen therapy with anastrozole 1 mg by mouth daily 5-7years started 12/09/2015  Anastrozole toxicities: Occasional hot flashes  Breast cancer surveillance: mammogram6/10/2020: Benign breast density categoryB Breast exam 10/03/2020: Benign Changed diet (less carbs and eating more fruits ane veg)  Bone density 10/26/2019: T score -1.5: Osteopenia  Return to clinic in 1 year for follow-up

## 2020-10-29 ENCOUNTER — Encounter: Payer: Self-pay | Admitting: Internal Medicine

## 2020-12-25 MED ORDER — ROSUVASTATIN CALCIUM 10 MG PO TABS: 10.0000 mg | ORAL_TABLET | Freq: Every day | ORAL | 0 refills | Status: DC

## 2021-02-10 ENCOUNTER — Encounter: Payer: 59 | Admitting: Internal Medicine

## 2021-04-09 ENCOUNTER — Other Ambulatory Visit: Payer: Self-pay

## 2021-04-09 ENCOUNTER — Encounter: Payer: Self-pay | Admitting: Internal Medicine

## 2021-04-09 ENCOUNTER — Ambulatory Visit (INDEPENDENT_AMBULATORY_CARE_PROVIDER_SITE_OTHER): Payer: Medicare Other | Admitting: Internal Medicine

## 2021-04-09 VITALS — BP 118/82 | HR 81 | Temp 97.8°F | Ht 65.0 in | Wt 170.0 lb

## 2021-04-09 DIAGNOSIS — Z87410 Personal history of cervical dysplasia: Secondary | ICD-10-CM | POA: Insufficient documentation

## 2021-04-09 DIAGNOSIS — Z0000 Encounter for general adult medical examination without abnormal findings: Secondary | ICD-10-CM | POA: Diagnosis not present

## 2021-04-09 DIAGNOSIS — R3129 Other microscopic hematuria: Secondary | ICD-10-CM

## 2021-04-09 DIAGNOSIS — R209 Unspecified disturbances of skin sensation: Secondary | ICD-10-CM

## 2021-04-09 DIAGNOSIS — E785 Hyperlipidemia, unspecified: Secondary | ICD-10-CM | POA: Diagnosis not present

## 2021-04-09 LAB — LIPID PANEL
Cholesterol: 183 mg/dL (ref 0–200)
HDL: 69.9 mg/dL (ref >39.00)
LDL Cholesterol: 91 mg/dL (ref 0–99)
NonHDL: 113.19
Total CHOL/HDL Ratio: 3
Triglycerides: 113 mg/dL (ref 0.0–149.0)
VLDL: 22.6 mg/dL (ref 0.0–40.0)

## 2021-04-09 LAB — COMPREHENSIVE METABOLIC PANEL
ALT: 14 U/L (ref 0–35)
AST: 16 U/L (ref 0–37)
Albumin: 4.6 g/dL (ref 3.5–5.2)
Alkaline Phosphatase: 88 U/L (ref 39–117)
BUN: 13 mg/dL (ref 6–23)
CO2: 27 mEq/L (ref 19–32)
Calcium: 9.9 mg/dL (ref 8.4–10.5)
Chloride: 107 mEq/L (ref 96–112)
Creatinine, Ser: 0.78 mg/dL (ref 0.40–1.20)
GFR: 78.01 mL/min (ref >60.00)
Glucose, Bld: 94 mg/dL (ref 70–99)
Potassium: 4.4 mEq/L (ref 3.5–5.1)
Sodium: 142 mEq/L (ref 135–145)
Total Bilirubin: 0.7 mg/dL (ref 0.2–1.2)
Total Protein: 7.8 g/dL (ref 6.0–8.3)

## 2021-04-09 LAB — CBC WITH DIFFERENTIAL/PLATELET
Basophils Absolute: 0 10*3/uL (ref 0.0–0.1)
Basophils Relative: 1.3 % (ref 0.0–3.0)
Eosinophils Absolute: 0.2 10*3/uL (ref 0.0–0.7)
Eosinophils Relative: 6 % — ABNORMAL HIGH (ref 0.0–5.0)
HCT: 41.1 % (ref 36.0–46.0)
Hemoglobin: 13.8 g/dL (ref 12.0–15.0)
Lymphocytes Relative: 44 % (ref 12.0–46.0)
Lymphs Abs: 1.7 10*3/uL (ref 0.7–4.0)
MCHC: 33.5 g/dL (ref 30.0–36.0)
MCV: 90.6 fl (ref 78.0–100.0)
Monocytes Absolute: 0.3 10*3/uL (ref 0.1–1.0)
Monocytes Relative: 8 % (ref 3.0–12.0)
Neutro Abs: 1.6 10*3/uL (ref 1.4–7.7)
Neutrophils Relative %: 40.7 % — ABNORMAL LOW (ref 43.0–77.0)
Platelets: 340 10*3/uL (ref 150.0–400.0)
RBC: 4.54 Mil/uL (ref 3.87–5.11)
RDW: 13 % (ref 11.5–15.5)
WBC: 3.8 10*3/uL — ABNORMAL LOW (ref 4.0–10.5)

## 2021-04-09 LAB — URINALYSIS, ROUTINE W REFLEX MICROSCOPIC
Bilirubin Urine: NEGATIVE
Ketones, ur: NEGATIVE
Nitrite: NEGATIVE
Specific Gravity, Urine: 1.01 (ref 1.000–1.030)
Total Protein, Urine: NEGATIVE
Urine Glucose: NEGATIVE
Urobilinogen, UA: 0.2 (ref 0.0–1.0)
pH: 6.5 (ref 5.0–8.0)

## 2021-04-09 LAB — TSH: TSH: 2.62 u[IU]/mL (ref 0.35–5.50)

## 2021-04-09 NOTE — Assessment & Plan Note (Signed)
°  We discussed age appropriate health related issues, including available/recomended screening tests and vaccinations. We discussed a need for adhering to healthy diet and exercise. Labs ordered. All questions were answered. Colon due 2022, last 2019

## 2021-04-09 NOTE — Progress Notes (Addendum)
Subjective:  Patient ID: Kristy Singh, female    DOB: [birth year]  Age: 71 y.o. MRN: [id number]  CC: No chief complaint on file.   HPI Lorrene Reid Gillingham presents for a well exam  Outpatient Medications Prior to Visit  Medication Sig Dispense Refill   anastrozole (ARIMIDEX) 1 MG tablet Take 1 tablet (1 mg total) by mouth daily. 90 tablet 3   Cholecalciferol (VITAMIN D3) 1000 UNITS tablet Take 1,000 Units by mouth daily.     Docusate Calcium (STOOL SOFTENER PO) Take 1 tablet by mouth every other day. Rexall stool softener     rosuvastatin (CRESTOR) 10 MG tablet Take 1 tablet (10 mg total) by mouth daily. Keep scheduled appt in December for future refills 90 tablet 0   Turmeric 500 MG CAPS Take 500 mg by mouth daily.     No facility-administered medications prior to visit.    ROS: Review of Systems  Constitutional:  Negative for activity change, appetite change, chills, fatigue and unexpected weight change.  HENT:  Negative for congestion, mouth sores and sinus pressure.   Eyes:  Negative for visual disturbance.  Respiratory:  Negative for cough and chest tightness.   Gastrointestinal:  Negative for abdominal pain and nausea.  Genitourinary:  Negative for difficulty urinating, frequency and vaginal pain.  Musculoskeletal:  Negative for back pain and gait problem.  Skin:  Negative for pallor and rash.  Neurological:  Negative for dizziness, tremors, weakness, numbness and headaches.  Psychiatric/Behavioral:  Negative for confusion and sleep disturbance.    Objective:  BP 118/82 (BP Location: Left Arm, Patient Position: Sitting, Cuff Size: Large)    Pulse 81    Temp 97.8 F (36.6 C) (Oral)    Ht 5\' 5"  (1.651 m)    Wt 170 lb (77.1 kg)    SpO2 99%    BMI 28.29 kg/m   BP Readings from Last 3 Encounters:  04/09/21 118/82  10/03/20 (!) 121/58  02/06/20 112/72    Wt Readings from Last 3 Encounters:  04/09/21 170 lb (77.1 kg)  10/03/20 167 lb 1.6 oz (75.8 kg)  02/06/20  177 lb 3.2 oz (80.4 kg)    Physical Exam Constitutional:      General: She is not in acute distress.    Appearance: She is well-developed.  HENT:     Head: Normocephalic.     Right Ear: External ear normal.     Left Ear: External ear normal.     Nose: Nose normal.  Eyes:     General:        Right eye: No discharge.        Left eye: No discharge.     Conjunctiva/sclera: Conjunctivae normal.     Pupils: Pupils are equal, round, and reactive to light.  Neck:     Thyroid: No thyromegaly.     Vascular: No JVD.     Trachea: No tracheal deviation.  Cardiovascular:     Rate and Rhythm: Normal rate and regular rhythm.     Heart sounds: Normal heart sounds.  Pulmonary:     Effort: No respiratory distress.     Breath sounds: No stridor. No wheezing.  Abdominal:     General: Bowel sounds are normal. There is no distension.     Palpations: Abdomen is soft. There is no mass.     Tenderness: There is no abdominal tenderness. There is no guarding or rebound.  Musculoskeletal:        General: No tenderness.  Cervical back: Normal range of motion and neck supple. No rigidity.  Lymphadenopathy:     Cervical: No cervical adenopathy.  Skin:    Findings: No erythema or rash.  Neurological:     Mental Status: She is oriented to person, place, and time.     Cranial Nerves: No cranial nerve deficit.     Motor: No abnormal muscle tone.     Coordination: Coordination normal.     Deep Tendon Reflexes: Reflexes normal.  Psychiatric:        Behavior: Behavior normal.        Thought Content: Thought content normal.        Judgment: Judgment normal.    Lab Results  Component Value Date   WBC 3.8 (L) 04/09/2021   HGB 13.8 04/09/2021   HCT 41.1 04/09/2021   PLT 340.0 04/09/2021   GLUCOSE 94 04/09/2021   CHOL 183 04/09/2021   TRIG 113.0 04/09/2021   HDL 69.90 04/09/2021   LDLDIRECT 189.4 08/29/2010   LDLCALC 91 04/09/2021   ALT 14 04/09/2021   AST 16 04/09/2021   NA 142 04/09/2021    K 4.4 04/09/2021   CL 107 04/09/2021   CREATININE 0.78 04/09/2021   BUN 13 04/09/2021   CO2 27 04/09/2021   TSH 2.62 04/09/2021    MM 3D SCREEN BREAST BILATERAL  Result Date: 08/01/2020 CLINICAL DATA:  Screening. EXAM: DIGITAL SCREENING BILATERAL MAMMOGRAM WITH TOMOSYNTHESIS AND CAD TECHNIQUE: Bilateral screening digital craniocaudal and mediolateral oblique mammograms were obtained. Bilateral screening digital breast tomosynthesis was performed. The images were evaluated with computer-aided detection. COMPARISON:  Previous exam(s). ACR Breast Density Category b: There are scattered areas of fibroglandular density. FINDINGS: There are no findings suspicious for malignancy. The images were evaluated with computer-aided detection. IMPRESSION: No mammographic evidence of malignancy. A result letter of this screening mammogram will be mailed directly to the patient. RECOMMENDATION: Screening mammogram in one year. (Code:SM-B-01Y) BI-RADS CATEGORY  1: Negative. Electronically Signed   By: Claudie Revering M.D.   On: 08/01/2020 08:58    Assessment & Plan:   Problem List Items Addressed This Visit     Dyslipidemia   Relevant Orders   CBC with Differential/Platelet (Completed)   Microscopic hematuria    Urology referral was advised      PARESTHESIA   Relevant Orders   CBC with Differential/Platelet (Completed)   Well adult exam - Primary     We discussed age appropriate health related issues, including available/recomended screening tests and vaccinations. We discussed a need for adhering to healthy diet and exercise. Labs ordered. All questions were answered. Colon due 2022, last 2019      Relevant Orders   TSH (Completed)   Urinalysis   CBC with Differential/Platelet (Completed)   Lipid panel (Completed)   Comprehensive metabolic panel (Completed)      No orders of the defined types were placed in this encounter.     Follow-up: Return in about 1 year (around 04/09/2022) for Wellness  Exam.  Walker Kehr, MD

## 2021-04-09 NOTE — Patient Instructions (Signed)
tDAP Shingrix

## 2021-04-10 ENCOUNTER — Encounter: Payer: Self-pay | Admitting: Internal Medicine

## 2021-04-10 DIAGNOSIS — R3129 Other microscopic hematuria: Secondary | ICD-10-CM | POA: Insufficient documentation

## 2021-04-10 NOTE — Assessment & Plan Note (Signed)
Urology referral was advised

## 2021-04-11 ENCOUNTER — Other Ambulatory Visit: Payer: Self-pay | Admitting: Internal Medicine

## 2021-04-11 DIAGNOSIS — R3129 Other microscopic hematuria: Secondary | ICD-10-CM

## 2021-04-11 NOTE — Progress Notes (Unsigned)
Urology referral

## 2021-04-16 ENCOUNTER — Encounter: Payer: Self-pay | Admitting: Internal Medicine

## 2021-05-26 ENCOUNTER — Ambulatory Visit (AMBULATORY_SURGERY_CENTER): Payer: Medicare Other

## 2021-05-26 VITALS — Ht 65.0 in | Wt 170.0 lb

## 2021-05-26 DIAGNOSIS — Z86010 Personal history of colonic polyps: Secondary | ICD-10-CM

## 2021-05-26 MED ORDER — ONDANSETRON HCL 4 MG PO TABS: 4.0000 mg | ORAL_TABLET | Freq: Three times a day (TID) | ORAL | 1 refills | Status: DC | PRN

## 2021-05-26 NOTE — Progress Notes (Signed)
No egg or soy allergy known to patient  ?No issues known to pt with past sedation with any surgeries or procedures ?Patient denies ever being told they had issues or difficulty with intubation  ?No FH of Malignant Hyperthermia ?Pt is not on diet pills ?Pt is not on  home 02  ?Pt is not on blood thinners  ?Pt denies issues with constipation  ?No A fib or A flutter ? ? ? ?Due to the COVID-19 pandemic we are asking patients to follow certain guidelines in PV and the [place]   ?Pt aware of COVID protocols and [facility] guidelines  ? ?PV completed over the phone. Pt verified name, DOB, address and insurance during PV today.  ?Pt mailed instruction packet with copy of consent form to read and not return, and instructions.  ?Pt encouraged to call with questions or issues.  ?If pt has My chart, procedure instructions also sent via My Chart  ? ? ?

## 2021-06-16 ENCOUNTER — Ambulatory Visit (AMBULATORY_SURGERY_CENTER): Payer: Medicare Other | Admitting: Internal Medicine

## 2021-06-16 ENCOUNTER — Encounter: Payer: Self-pay | Admitting: Internal Medicine

## 2021-06-16 VITALS — BP 131/60 | HR 60 | Temp 97.5°F | Resp 11 | Ht 65.0 in | Wt 168.0 lb

## 2021-06-16 DIAGNOSIS — D125 Benign neoplasm of sigmoid colon: Secondary | ICD-10-CM | POA: Diagnosis not present

## 2021-06-16 DIAGNOSIS — Z86010 Personal history of colonic polyps: Secondary | ICD-10-CM | POA: Diagnosis not present

## 2021-06-16 DIAGNOSIS — D122 Benign neoplasm of ascending colon: Secondary | ICD-10-CM

## 2021-06-16 DIAGNOSIS — D123 Benign neoplasm of transverse colon: Secondary | ICD-10-CM

## 2021-06-16 MED ORDER — SODIUM CHLORIDE 0.9 % IV SOLN: 500.0000 mL | Freq: Once | INTRAVENOUS | Status: DC

## 2021-06-16 NOTE — Progress Notes (Signed)
Pt's states no medical or surgical changes since previsit or office visit. 

## 2021-06-16 NOTE — Progress Notes (Signed)
Called to room to assist during endoscopic procedure.  Patient ID and intended procedure confirmed with present staff. Received instructions for my participation in the procedure from the performing physician.  

## 2021-06-16 NOTE — Op Note (Signed)
[facility] ?Patient Name: Kristy Singh ?Procedure Date: 06/16/2021 9:06 AM ?MRN: [id number] ?Endoscopist: Gatha Mayer , MD ?Age: 71 ?Referring MD:  ?Date of Birth: [birth year] ?Gender: Female ?Account #: [id number] ?Procedure:                Colonoscopy ?Indications:              Surveillance: Personal history of adenomatous  ?                          polyps on last colonoscopy > 3 years ago, Last  ?                          colonoscopy: 2019 ?Medicines:                Monitored Anesthesia Care ?Procedure:                Pre-Anesthesia Assessment: ?                          - Prior to the procedure, a History and Physical  ?                          was performed, and patient medications and  ?                          allergies were reviewed. The patient's tolerance of  ?                          previous anesthesia was also reviewed. The risks  ?                          and benefits of the procedure and the sedation  ?                          options and risks were discussed with the patient.  ?                          All questions were answered, and informed consent  ?                          was obtained. Prior Anticoagulants: The patient has  ?                          taken no previous anticoagulant or antiplatelet  ?                          agents. ASA Grade Assessment: II - A patient with  ?                          mild systemic disease. After reviewing the risks  ?                          and benefits, the patient was deemed in  ?  satisfactory condition to undergo the procedure. ?                          After obtaining informed consent, the colonoscope  ?                          was passed under direct vision. Throughout the  ?                          procedure, the patient's blood pressure, pulse, and  ?                          oxygen saturations were monitored continuously. The  ?                          [id number]-HQ190L Colonoscope was introduced through the   ?                          anus and advanced to the the cecum, identified by  ?                          appendiceal orifice and ileocecal valve. The  ?                          colonoscopy was performed without difficulty. The  ?                          patient tolerated the procedure well. The quality  ?                          of the bowel preparation was good. The ileocecal  ?                          valve, appendiceal orifice, and rectum were  ?                          photographed. The bowel preparation used was  ?                          Miralax via split dose instruction. ?Scope In: 9:17:52 AM ?Scope Out: 9:38:52 AM ?Scope Withdrawal Time: 0 hours 13 minutes 44 seconds  ?Total Procedure Duration: 0 hours 21 minutes 0 seconds  ?Findings:                 Skin tags were found on perianal exam. ?                          Five sessile polyps were found in the sigmoid  ?                          colon, transverse colon and ascending colon. The  ?                          polyps were diminutive in size. These polyps were  ?  removed with a cold snare. Resection and retrieval  ?                          were complete. Verification of patient  ?                          identification for the specimen was done. Estimated  ?                          blood loss was minimal. ?                          Multiple diverticula were found in the sigmoid  ?                          colon and descending colon. ?                          The exam was otherwise without abnormality on  ?                          direct and retroflexion views. ?Complications:            No immediate complications. ?Estimated Blood Loss:     Estimated blood loss was minimal. ?Impression:               - Perianal skin tags found on perianal exam. ?                          - Five diminutive polyps in the sigmoid colon, in  ?                          the transverse colon and in the ascending colon,  ?                           removed with a cold snare. Resected and retrieved. ?                          - Diverticulosis in the sigmoid colon and in the  ?                          descending colon. ?                          - The examination was otherwise normal on direct  ?                          and retroflexion views. ?                          - Personal history of colonic polyps. ?                          06/2003 TV adenoma and tubular adenoma ?  2009 rectal hyperplastic polyps ?                          06/10/2017 6 diminutive and one 15 mm polyp all  ?                          adenomas ?Recommendation:           - Patient has a contact number available for  ?                          emergencies. The signs and symptoms of potential  ?                          delayed complications were discussed with the  ?                          patient. Return to normal activities tomorrow.  ?                          Written discharge instructions were provided to the  ?                          patient. ?                          - Resume previous diet. ?                          - Continue present medications. ?                          - Repeat colonoscopy is recommended for  ?                          surveillance. The colonoscopy date will be  ?                          determined after pathology results from today's  ?                          exam become available for review. ?Gatha Mayer, MD ?06/16/2021 9:49:50 AM ?This report has been signed electronically. ?

## 2021-06-16 NOTE — Progress Notes (Signed)
[facility] History and Physical ? ? ?Primary Care Physician:  Cassandria Anger, MD ? ? ?Reason for Procedure:   Hx adenomatous colon polyps ? ?Plan:    colonoscopy ? ? ? ? ?HPI: Kristy Singh is a 71 y.o. female w/ hx 7 adenomas removed 4/209 - max 15 mm. ? ? ?Past Medical History:  ?Diagnosis Date  ? Breast cancer ([place]) 2017  ? Right Breast  ? Cancer ([facility])   ? right breast cancer  ? Gallstones   ? GERD (gastroesophageal reflux disease)   ? History of bronchitis   ? History of gallstones   ? Hx of adenomatous colonic polyps 07/06/2003  ? Hyperlipidemia   ? Influenza   ? Influenza A 06/03/2017  ? 4/19  ? Kidney stones   ? Dr Dorina Hoyer  ? Numbness and tingling in hands   ? Personal history of radiation therapy 2017  ? Right Breast Cancer  ? Prolapsed internal hemorrhoids, grade 2 and 3 07/21/2017  ? Wears glasses   ? ? ?Past Surgical History:  ?Procedure Laterality Date  ? ABDOMINAL HYSTERECTOMY  2006  ? BREAST LUMPECTOMY Right 2017  ? BREAST LUMPECTOMY WITH RADIOACTIVE SEED AND SENTINEL LYMPH NODE BIOPSY Right 08/02/2015  ? Procedure: BREAST LUMPECTOMY WITH RADIOACTIVE SEED AND SENTINEL LYMPH NODE BIOPSY;  Surgeon: Autumn Messing III, MD;  Location: [facility];  Service: General;  Laterality: Right;  ? CHOLECYSTECTOMY  2008  ? Dr. Autumn Messing  in [place], had ERCP post -op   ? COLONOSCOPY  2005, 2009  ? 05/2017- [facility], TA's  ? CYSTO    ? with holmium lasar  ? [place] OF UTERUS  1980  ? ERCP    ? HEMORRHOID BANDING    ? NSVD    ? x2  ? OVARIAN CYST REMOVAL Right   ? POLYPECTOMY    ? TONSILLECTOMY    ? ? ?Prior to Admission medications   ?Medication Sig Start Date End Date Taking? Authorizing Provider  ?anastrozole (ARIMIDEX) 1 MG tablet Take 1 tablet (1 mg total) by mouth daily. 10/03/20  Yes Nicholas Lose, MD  ?Cholecalciferol (VITAMIN D3) 1000 UNITS tablet Take 1,000 Units by mouth daily.   Yes [provider]  ?ondansetron (ZOFRAN) 4 MG tablet Take 1 tablet (4 mg total) by mouth  every 8 (eight) hours as needed for nausea or vomiting. 05/26/21  Yes Gatha Mayer, MD  ?Turmeric 500 MG CAPS Take 500 mg by mouth daily. 09/16/16  Yes Nicholas Lose, MD  ?Docusate Calcium (STOOL SOFTENER PO) Take 1 tablet by mouth every other day. Rexall stool softener    [provider]  ?rosuvastatin (CRESTOR) 10 MG tablet Take 1 tablet (10 mg total) by mouth daily. Keep scheduled appt in December for future refills 12/25/20   Plotnikov, Evie Lacks, MD  ? ? ?Current Outpatient Medications  ?Medication Sig Dispense Refill  ? anastrozole (ARIMIDEX) 1 MG tablet Take 1 tablet (1 mg total) by mouth daily. 90 tablet 3  ? Cholecalciferol (VITAMIN D3) 1000 UNITS tablet Take 1,000 Units by mouth daily.    ? ondansetron (ZOFRAN) 4 MG tablet Take 1 tablet (4 mg total) by mouth every 8 (eight) hours as needed for nausea or vomiting. 30 tablet 1  ? Turmeric 500 MG CAPS Take 500 mg by mouth daily.    ? Docusate Calcium (STOOL SOFTENER PO) Take 1 tablet by mouth every other day. Rexall stool softener    ? rosuvastatin (CRESTOR) 10 MG tablet Take 1  tablet (10 mg total) by mouth daily. Keep scheduled appt in December for future refills 90 tablet 0  ? ?Current Facility-Administered Medications  ?Medication Dose Route Frequency Provider Last Rate Last Admin  ? 0.9 %  sodium chloride infusion  500 mL Intravenous Once Gatha Mayer, MD      ? ? ?Allergies as of 06/16/2021  ? (No Known Allergies)  ? ? ?Family History  ?Problem Relation Age of Onset  ? Hypertension Mother   ? Hyperlipidemia Mother   ? Breast cancer Mother 24  ? Colon polyps Father   ? Colon cancer Father 67  ? Colon polyps Sister   ? Colon polyps Sister   ? Hypertension Other   ? Rectal cancer Neg Hx   ? Stomach cancer Neg Hx   ? ? ?Social History  ? ?Socioeconomic History  ? Marital status: Married  ?  Spouse name: Not on file  ? Number of children: Not on file  ? Years of education: Not on file  ? Highest education level: Not on file  ?Occupational History   ? Occupation: computers  ?Tobacco Use  ? Smoking status: Never  ?  Passive exposure: Past (in the 67s in offices)  ? Smokeless tobacco: Never  ?Vaping Use  ? Vaping Use: Never used  ?Substance and Sexual Activity  ? Alcohol use: Never  ? Drug use: Never  ? Sexual activity: Yes  ?  Birth control/protection: Surgical  ?Other Topics Concern  ? ? ? ?Review of Systems: ? ?All other review of systems negative except as mentioned in the HPI. ? ?Physical Exam: ?Vital signs ?BP 125/77   Pulse (!) 59   Temp (!) 97.5 ?F (36.4 ?C)   Ht '5\' 5"'$  (1.651 m)   Wt 168 lb (76.2 kg)   SpO2 98%   BMI 27.96 kg/m?  ? ?General:   Alert,  Well-developed, well-nourished, pleasant and cooperative in NAD ?Lungs:  Clear throughout to auscultation.   ?Heart:  Regular rate and rhythm; no murmurs, clicks, rubs,  or gallops. ?Abdomen:  Soft, nontender and nondistended. Normal bowel sounds.   ?Neuro/Psych:  Alert and cooperative. Normal mood and affect. A and O x 3 ? ? ?'@Doy Taaffe'$  Simonne Maffucci, MD, [facility] ?[facility] ?[phone] (pager) ?06/16/2021 9:07 AM@ ? ?

## 2021-06-16 NOTE — Progress Notes (Signed)
PT taken to PACU. Monitors in place. VSS. Report given to RN. 

## 2021-06-16 NOTE — Patient Instructions (Addendum)
I found and removed 5 tiny polyps today. ? ?I also saw diverticulosis. ? ?I will let you know pathology results and when to have another routine colonoscopy by mail and/or My Chart. ? ?I appreciate the opportunity to care for you. ?Gatha Mayer, MD, [facility] Regal ? ? ?YOU HAD AN ENDOSCOPIC PROCEDURE TODAY AT [place]:   Refer to the procedure report that was given to you for any specific questions about what was found during the examination.  If the procedure report does not answer your questions, please call your gastroenterologist to clarify.  If you requested that your care partner not be given the details of your procedure findings, then the procedure report has been included in a sealed envelope for you to review at your convenience later. ? ?YOU SHOULD EXPECT: Some feelings of bloating in the abdomen. Passage of more gas than usual.  Walking can help get rid of the air that was put into your GI tract during the procedure and reduce the bloating. If you had a lower endoscopy (such as a colonoscopy or flexible sigmoidoscopy) you may notice spotting of blood in your stool or on the toilet paper. If you underwent a bowel prep for your procedure, you may not have a normal bowel movement for a few days. ? ?Please Note:  You might notice some irritation and congestion in your nose or some drainage.  This is from the oxygen used during your procedure.  There is no need for concern and it should clear up in a day or so. ? ?SYMPTOMS TO REPORT IMMEDIATELY: ? ?Following lower endoscopy (colonoscopy or flexible sigmoidoscopy): ? Excessive amounts of blood in the stool ? Significant tenderness or worsening of abdominal pains ? Swelling of the abdomen that is new, acute ? Fever of 100?F or higher ? ? ? ?For urgent or emergent issues, a gastroenterologist can be reached at any hour by calling [phone]. ?Do not use MyChart messaging for urgent concerns.  ? ? ?DIET:  We do recommend a small meal at first,  but then you may proceed to your regular diet.  Drink plenty of fluids but you should avoid alcoholic beverages for 24 hours. ? ?ACTIVITY:  You should plan to take it easy for the rest of today and you should NOT DRIVE or use heavy machinery until tomorrow (because of the sedation medicines used during the test).   ? ?FOLLOW UP: ?Our staff will call the number listed on your records 48-72 hours following your procedure to check on you and address any questions or concerns that you may have regarding the information given to you following your procedure. If we do not reach you, we will leave a message.  We will attempt to reach you two times.  During this call, we will ask if you have developed any symptoms of COVID 19. If you develop any symptoms (ie: fever, flu-like symptoms, shortness of breath, cough etc.) before then, please call [phone].  If you test positive for Covid 19 in the 2 weeks post procedure, please call and report this information to [place].   ? ?If any biopsies were taken you will be contacted by phone or by letter within the next 1-3 weeks.  Please call us at [phone] if you have not heard about the biopsies in 3 weeks.  ? ? ?SIGNATURES/CONFIDENTIALITY: ?You and/or your care partner have signed paperwork which will be entered into your electronic medical record.  These signatures attest to the fact that  that the information above on your After Visit Summary has been reviewed and is understood.  Full responsibility of the confidentiality of this discharge information lies with you and/or your care-partner.  ?

## 2021-06-20 ENCOUNTER — Encounter: Payer: Self-pay | Admitting: Internal Medicine

## 2021-09-22 NOTE — Progress Notes (Signed)
HEMATOLOGY-ONCOLOGY [place] VISIT PROGRESS NOTE  I connected with Kristy Singh on 10/01/2021 at 10:15 AM EDT by Mychart video conference and verified that I am speaking with the correct person using two identifiers.  I discussed the limitations, risks, security and privacy concerns of performing an evaluation and management service by Webex and the availability of in person appointments.  I also discussed with the patient that there may be a patient responsible charge related to this service. The patient expressed understanding and agreed to proceed.  Patient's Location: Home Physician Location: Clinic  CHIEF COMPLIANT: Surveillance of breast cancer.  INTERVAL HISTORY: Kristy Singh is a 71 y.o. female with above-mentioned history of right breast cancer status postlumpectomy radiation is currently on anastrozole therapy.  She is doing extremely well from anastrozole.  She does not have any major side effects.  Denies any hot flashes or arthralgias or myalgias.  Denies any lumps or nodules in the breast.  Oncology History  Breast cancer of upper-inner quadrant of right female breast ([place])  07/04/2015 Mammogram   Right breast mass 7 mm 12:30 position   07/05/2015 Initial Diagnosis   Right breast biopsy 12:30 position: Invasive ductal carcinoma grade 2, ER 100%, PR 95%, HER-2 negative ratio 1.29, K 67 15%, T1 BN 0 stage IA   08/02/2015 Surgery   Right lumpectomy [name]): Invasive ductal carcinoma grade 2, 0.7 cm, margins negative, 0/3 sentinel nodes, ER 100%, PR 95%, HER-2 negative ratio 1.17, Ki-67 15%, T1 BN 0 stage IA Oncotype DX score 21, 13% ROR   09/16/2015 - 10/11/2015 Radiation Therapy   Radiation [name]) to right breast: 42.5Gy in 17 fractions, right breast boost: 7.5 Gy in 3 fractions.     12/09/2015 -  Anti-estrogen oral therapy   Anastrozole 1 mg by mouth daily     REVIEW OF SYSTEMS:   Constitutional: Denies fevers, chills or abnormal weight loss Eyes: Denies  blurriness of vision Ears, nose, mouth, throat, and face: Denies mucositis or sore throat Respiratory: Denies cough, dyspnea or wheezes Cardiovascular: Denies palpitation, chest discomfort Gastrointestinal:  Denies nausea, heartburn or change in bowel habits Skin: Denies abnormal skin rashes Lymphatics: Denies new lymphadenopathy or easy bruising Neurological:Denies numbness, tingling or new weaknesses Behavioral/Psych: Mood is stable, no new changes  Extremities: No lower extremity edema Breast: denies any pain or lumps or nodules in either breasts All other systems were reviewed with the patient and are negative.  Observations/Objective:  There were no vitals filed for this visit. There is no height or weight on file to calculate BMI.  I have reviewed the data as listed    Latest Ref Rng & Units 04/09/2021   11:46 AM 02/06/2020    8:42 AM 01/03/2019    9:20 AM  CMP  Glucose 70 - 99 mg/dL 94  97  107   BUN 6 - 23 mg/dL _0 Creatinine 0.40 - 1.20 mg/dL 0.78  0.73  0.82   Sodium 135 - 145 mEq/L 142  141  140   Potassium 3.5 - 5.1 mEq/L 4.4  3.9  4.2   Chloride 96 - 112 mEq/L 107  107  106   CO2 19 - 32 mEq/L _1 Calcium 8.4 - 10.5 mg/dL 9.9  9.6  9.6   Total Protein 6.0 - 8.3 g/dL 7.8  7.0  7.1   Total Bilirubin 0.2 - 1.2 mg/dL 0.7  0.7  0.7   Alkaline Phos 39 -  117 U/L 88  94  103   AST 0 - 37 U/L _0 ALT 0 - 35 U/L _1 Lab Results  Component Value Date   WBC 3.8 (L) 04/09/2021   HGB 13.8 04/09/2021   HCT 41.1 04/09/2021   MCV 90.6 04/09/2021   PLT 340.0 04/09/2021   NEUTROABS 1.6 04/09/2021      Assessment Plan:  Breast cancer of upper-inner quadrant of right female breast ([place]) Right lumpectomy 08/02/2015: Invasive ductal carcinoma grade 2, 0.7 cm, margins negative, 0/3 sentinel nodes, ER 100%, PR 95%, HER-2 negative ratio 1.29, Ki-67 15%, T1 BN 0 stage IA Oncotype DX score 21, 13% ROR, did not require chemotherapy,  intermediate risk Adjuvant radiation therapy from 09/16/2015 to 10/11/2015   Treatment plan: Adjuvant antiestrogen therapy with anastrozole 1 mg by mouth daily 5-7 years started 12/09/2015   Anastrozole toxicities: Occasional hot flashes   Breast cancer surveillance:   mammogram 08/01/2020: Benign breast density category  B, planning to do it in August Breast exam 10/03/2020: Benign  Changed diet (less carbs and eating more fruits ane veg)   Bone density 10/26/2019: T score -1.5: Osteopenia   Return to clinic in 1 year for follow-up   I discussed the assessment and treatment plan with the patient. The patient was provided an opportunity to ask questions and all were answered. The patient agreed with the plan and demonstrated an understanding of the instructions. The patient was advised to call back or seek an in-person evaluation if the symptoms worsen or if the condition fails to improve as anticipated.   I provided 20 minutes of face-to-face Web Ex time during this encounter.    Rulon Eisenmenger, MD 10/01/2021  I Gardiner Coins am scribing for Dr. Lindi Adie  I have reviewed the above documentation for accuracy and completeness, and I agree with the above.

## 2021-09-30 NOTE — Assessment & Plan Note (Signed)
Right lumpectomy 08/02/2015: Invasive ductal carcinoma grade 2, 0.7 cm, margins negative, 0/3 sentinel nodes, ER 100%, PR 95%, HER-2 negative ratio 1.29, Ki-67 15%, T1 BN 0 stage IA Oncotype DX score 21, 13% ROR, did not require chemotherapy, intermediate risk Adjuvant radiation therapy from 09/16/2015 to 10/11/2015  Treatment plan: Adjuvant antiestrogen therapy with anastrozole 1 mg by mouth daily 7years started 12/09/2015  Anastrozole toxicities: No hot flashes  Breast cancer surveillance: mammogram6/10/2020: Benign breast density categoryB Breast exam 10/03/2020: Benign Changed diet (less carbs and eating more fruits ane veg)  Bone density 10/26/2019: T score -1.5: Osteopenia  Return to clinic in 1 year for follow-up

## 2021-10-01 ENCOUNTER — Inpatient Hospital Stay: Payer: Medicare Other | Attending: Hematology and Oncology | Admitting: Hematology and Oncology

## 2021-10-01 DIAGNOSIS — Z170 Estrogen receptor positive status [ER+]: Secondary | ICD-10-CM | POA: Diagnosis not present

## 2021-10-01 DIAGNOSIS — C50211 Malignant neoplasm of upper-inner quadrant of right female breast: Secondary | ICD-10-CM | POA: Diagnosis not present

## 2021-10-01 MED ORDER — ANASTROZOLE 1 MG PO TABS: 1.0000 mg | ORAL_TABLET | Freq: Every day | ORAL | 3 refills | Status: DC

## 2021-11-04 LAB — HM PAP SMEAR: HM Pap smear: NORMAL

## 2021-11-05 ENCOUNTER — Encounter: Payer: Self-pay | Admitting: Internal Medicine

## 2022-01-12 NOTE — Progress Notes (Unsigned)
Subjective:   Kristy Singh is a 71 y.o. female who presents for an Initial Medicare Annual Wellness Visit. I connected with  Lorrene Reid Aguinaga on 01/13/22 by a audio enabled telemedicine application and verified that I am speaking with the correct person using two identifiers.  Patient Location: Home  Provider Location: Home Office  I discussed the limitations of evaluation and management by telemedicine. The patient expressed understanding and agreed to proceed.  Review of Systems    Deferred to PCP Cardiac Risk Factors include: advanced age (>72mn, >>58women);dyslipidemia;hypertension     Objective:    There were no vitals filed for this visit. There is no height or weight on file to calculate BMI.     01/13/2022   11:24 AM 09/16/2016    8:23 AM 03/19/2016    9:38 AM 12/09/2015    5:14 PM 11/12/2015    1:39 PM 09/04/2015    8:47 AM 07/30/2015    8:58 AM  Advanced Directives  Does Patient Have a Medical Advance Directive? No Yes No No No No No  Would patient like information on creating a medical advance directive? No - Patient declined    No - patient declined information No - patient declined information No - patient declined information    Current Medications (verified) Outpatient Encounter Medications as of 01/13/2022  Medication Sig   anastrozole (ARIMIDEX) 1 MG tablet Take 1 tablet (1 mg total) by mouth daily.   Cholecalciferol (VITAMIN D3) 1000 UNITS tablet Take 1,000 Units by mouth daily.   Docusate Calcium (STOOL SOFTENER PO) Take 1 tablet by mouth every other day. [employer] stool softener   Turmeric 500 MG CAPS Take 500 mg by mouth daily.   ondansetron (ZOFRAN) 4 MG tablet Take 1 tablet (4 mg total) by mouth every 8 (eight) hours as needed for nausea or vomiting.   No facility-administered encounter medications on file as of 01/13/2022.    Allergies (verified) Patient has no known allergies.   History: Past Medical History:  Diagnosis Date    [place]   [place]   [place]   Gallstones    GERD (gastroesophageal reflux disease)    History of bronchitis    History of gallstones    Hx of adenomatous colonic polyps 07/06/2003   Hyperlipidemia    Influenza    Influenza A 06/03/2017   4/19   Kidney stones    Dr DDorina Hoyer  Numbness and tingling in hands    Personal history of radiation therapy 2017   Right Breast Cancer   Prolapsed internal hemorrhoids, grade 2 and 3 07/21/2017   Wears glasses    Past Surgical History:  Procedure Laterality Date   ABDOMINAL HYSTERECTOMY  2006   BREAST LUMPECTOMY Right 2017   BREAST LUMPECTOMY WITH RADIOACTIVE SEED AND SENTINEL LYMPH NODE BIOPSY Right 08/02/2015   Procedure: BREAST LUMPECTOMY WITH RADIOACTIVE SEED AND SENTINEL LYMPH NODE BIOPSY;  Surgeon: PAutumn MessingIII, MD;  [place] [facility]  Service: General;  Laterality: Right;   CHOLECYSTECTOMY  2008   [place]    COLONOSCOPY  2005, 2009   05/2017- CG, TA's   CYSTO     with holmium lasar   DILATION AND CURETTAGE OF UTERUS  1980   ERCP     HEMORRHOID BANDING     NSVD     x2   OVARIAN CYST REMOVAL Right  POLYPECTOMY     TONSILLECTOMY     Family History  Problem Relation Age of Onset   Hypertension Mother    Hyperlipidemia Mother    Breast cancer Mother 39   Colon polyps Father    Colon cancer Father 29   Colon polyps Sister    Colon polyps Sister    Hypertension Other    Rectal cancer Neg Hx    Stomach cancer Neg Hx    Social History   Socioeconomic History   Marital status: Married    Spouse name: Not on file   Number of children: Not on file   Years of education: Not on file   Highest education level: Not on file  Occupational History   Occupation: computers  Tobacco Use   Smoking status: Never    Passive exposure: Past (in the 80s in offices)   Smokeless tobacco: Never  Vaping Use   Vaping Use: Never used  Substance and  Sexual Activity   Alcohol use: Never   Drug use: Never   Sexual activity: Yes    Birth control/protection: Surgical  Other Topics Concern   Not on file  Social History Narrative   Not on file   Social Determinants of Health   Financial Resource Strain: Low Risk  (01/13/2022)   Overall Financial Resource Strain (CARDIA)    Difficulty of Paying Living Expenses: Not hard at all  Food Insecurity: No Food Insecurity (01/13/2022)   Hunger Vital Sign    Worried About Running Out of Food in the Last Year: Never true    Ran Out of Food in the Last Year: Never true  Transportation Needs: No Transportation Needs (01/13/2022)   PRAPARE - Hydrologist (Medical): No    Lack of Transportation (Non-Medical): No  Physical Activity: Insufficiently Active (01/13/2022)   Exercise Vital Sign    Days of Exercise per Week: 4 days    Minutes of Exercise per Session: 30 min  Stress: No Stress Concern Present (01/13/2022)   [place]    Feeling of Stress : Not at all  Social Connections: [place] (01/13/2022)   Social Connection and Isolation Panel [NHANES]    Frequency of Communication with Friends and Family: More than three times a week    Frequency of Social Gatherings with Friends and Family: More than three times a week    Attends Religious Services: More than 4 times per year    Active Member of [employer] or Organizations: Yes    Attends Music therapist: More than 4 times per year    Marital Status: Married    Tobacco Counseling Counseling given: Not Answered   Clinical Intake:  Pre-visit preparation completed: Yes  Pain : No/denies pain     Nutritional Risks: None Diabetes: No  How often do you need to have someone help you when you read instructions, pamphlets, or other written materials from your doctor or pharmacy?: 1 - Never What is the last grade level you  completed in school?: college  Diabetic?No  Interpreter Needed?: No  Information entered by :: Emelia Loron RN   Activities of Daily Living    01/13/2022   11:20 AM  In your present state of health, do you have any difficulty performing the following activities:  Hearing? 0  Vision? 0  Difficulty concentrating or making decisions? 0  Walking or climbing stairs? 0  Dressing or bathing? 0  Doing errands,  shopping? 0  Preparing Food and eating ? N  Using the Toilet? N  In the past six months, have you accidently leaked urine? N  Do you have problems with loss of bowel control? N  Managing your Medications? N  Managing your Finances? N  Housekeeping or managing your Housekeeping? N    Patient Care Team: Plotnikov, Evie Lacks, MD as PCP - General [name], MD as Consulting Physician (Obstetrics and Gynecology) Nicholas Lose, MD as Consulting Physician (Hematology and Oncology) Jovita Kussmaul, MD as Consulting Physician (General Surgery) Kyung Rudd, MD as Consulting Physician (Radiation Oncology) Gatha Mayer, MD as Consulting Physician (Gastroenterology)  Indicate any recent Medical Services you may have received from other than [facility] providers in the past year (date may be approximate).     Assessment:   This is a routine wellness examination for [place].  Hearing/Vision screen No results found.  Dietary issues and exercise activities discussed: Current Exercise Habits: Home exercise routine, Type of exercise: walking, Time (Minutes): 40, Frequency (Times/Week): 4, Weekly Exercise (Minutes/Week): 160, Intensity: Mild, Exercise limited by: None identified   Goals Addressed             This Visit's Progress    Patient Stated       Stay independent and healthy for as long as possible.      Depression Screen    01/13/2022   11:28 AM 04/09/2021   11:12 AM 02/06/2020    8:14 AM 12/15/2017    7:49 AM 12/04/2016    9:07 AM 11/12/2015    1:39 PM 09/04/2015     8:47 AM  PHQ 2/9 Scores  PHQ - 2 Score 0 0 0 0 0 0 0    Fall Risk    01/13/2022   11:25 AM 04/09/2021   11:11 AM 02/06/2020    8:14 AM 12/15/2017    7:49 AM 11/12/2015    1:39 PM  Fall Risk   Falls in the past year? 0 0 0 No No  Number falls in past yr: 0 0 0    Injury with Fall? 0 0 0    Risk for fall due to : No Fall Risks No Fall Risks     Follow up Falls evaluation completed Falls evaluation completed       FALL RISK PREVENTION PERTAINING TO THE HOME:  Any stairs in or around the home? Yes  If so, are there any without handrails? Yes  Home free of loose throw rugs in walkways, pet beds, electrical cords, etc? Yes  Adequate lighting in your home to reduce risk of falls? Yes   ASSISTIVE DEVICES UTILIZED TO PREVENT FALLS:  Life alert? No  Use of a cane, walker or w/c? No  Grab bars in the bathroom? No  Shower chair or bench in shower? No  Elevated toilet seat or a handicapped toilet? No   Cognitive Function:        01/13/2022   11:25 AM  6CIT Screen  What Year? 0 points  What month? 0 points  What time? 0 points  Count back from 20 0 points  Months in reverse 0 points  Repeat phrase 0 points  Total Score 0 points    Immunizations Immunization History  Administered Date(s) Administered   Fluad Quad(high Dose 65+) 12/12/2018, 11/24/2019   Influenza Split 11/18/2011   Influenza Whole 12/25/2010   Influenza, High Dose Seasonal PF 12/15/2017   Influenza,inj,Quad PF,6+ Mos 11/28/2013, 11/30/2014, 12/03/2015   Influenza-Unspecified 11/20/2016  Moderna SARS-COV2 Booster Vaccination 01/10/2020   Moderna Sars-Covid-2 Vaccination 04/24/2019, 05/19/2019   Pneumococcal Conjugate-13 12/15/2017   Pneumococcal Polysaccharide-23 01/03/2019   Tdap 11/24/2011   Zoster, Live 11/24/2011   Flu Vaccine status: Up to date; patient reports this is up to date; she will have [facility] send records to PCP  Pneumococcal vaccine status: Up to date  Covid-19 vaccine status:  Information provided on how to obtain vaccines. patient reports this is up to date; she will have [facility] to PCP  Qualifies for Shingles Vaccine? Yes   Zostavax completed No   Shingrix Completed?: Yes, patient reports this is up to date; she will have [facility] send [facility] to PCP  Screening Tests Health Maintenance  Topic Date Due   Zoster Vaccines- Shingrix (1 of 2) Never done   COVID-19 Vaccine (3 - Moderna risk series) 02/07/2020   INFLUENZA VACCINE  05/24/2022 (Originally 09/23/2021)   MAMMOGRAM  07/31/2022   Medicare Annual Wellness (AWV)  01/14/2023   COLONOSCOPY (Pts 45-58yr Insurance coverage will need to be confirmed)  06/16/2024   Pneumonia Vaccine 69 Years old  Completed   DEXA SCAN  Completed   Hepatitis C Screening  Completed   HPV VACCINES  Aged Out    Health Maintenance  Health Maintenance Due  Topic Date Due   Zoster Vaccines- Shingrix (1 of 2) Never done   COVID-19 Vaccine (3 - Moderna risk series) 02/07/2020    Colorectal cancer screening: Type of screening: Colonoscopy. Completed 06/16/21. Repeat every 3 years  Mammogram status: Completed 08/01/20. Repeat every year  Bone Density status: Completed 10/25/19. Results reflect: Bone density results: OSTEOPENIA. Repeat every 2 years.  Lung Cancer Screening: (Low Dose CT Chest recommended if Age 69-80years, 30 pack-year currently smoking OR have quit w/in 15years.) does not qualify.   Additional Screening:  Hepatitis C Screening: does qualify; Completed 12/03/15  Vision Screening: Recommended annual ophthalmology exams for early detection of glaucoma and other disorders of the eye. Is the patient up to date with their annual eye exam?  Yes  Who is the provider or what is the name of the office in which the patient attends annual eye exams? Dr. BPenny PiaIf pt is not established with a provider, would they like to be referred to a provider to establish care?  N/A .   Dental Screening: Recommended annual dental  exams for proper oral hygiene  Community Resource Referral / Chronic Care Management: CRR required this visit?  No   CCM required this visit?  No      Plan:     I have personally reviewed and noted the following in the patient's chart:   Medical and social history Use of alcohol, tobacco or illicit drugs  Current medications and supplements including opioid prescriptions. Patient is not currently taking opioid prescriptions. Functional ability and status Nutritional status Physical activity Advanced directives List of other physicians Hospitalizations, surgeries, and ER visits in previous 12 months Vitals Screenings to include cognitive, depression, and falls Referrals and appointments  In addition, I have reviewed and discussed with patient certain preventive protocols, quality metrics, and best practice recommendations. A written personalized care plan for preventive services as well as general preventive health recommendations were provided to patient.     JMichiel Cowboy RN   01/13/2022   Nurse Notes:  Ms. PKueker, Thank you for taking time to come for your Medicare Wellness Visit. I appreciate your ongoing commitment to your health goals. Please review the following  plan we discussed and let me know if I can assist you in the future.   These are the goals we discussed:  Goals      Patient Stated     Stay independent and healthy for as long as possible.        This is a list of the screening recommended for you and due dates:  Health Maintenance  Topic Date Due   Zoster (Shingles) Vaccine (1 of 2) Never done   COVID-19 Vaccine (3 - Moderna risk series) 02/07/2020   Flu Shot  05/24/2022*   Mammogram  07/31/2022   Medicare Annual Wellness Visit  01/14/2023   Colon Cancer Screening  06/16/2024   Pneumonia Vaccine  Completed   DEXA scan (bone density measurement)  Completed   Hepatitis C Screening: USPSTF Recommendation to screen - Ages 33-79 yo.  Completed    HPV Vaccine  Aged Out  *Topic was postponed. The date shown is not the original due date.   Medical screening examination/treatment/procedure(s) were performed by non-physician practitioner and as supervising physician I was immediately available for consultation/collaboration.  I agree with above. Lew Dawes, MD

## 2022-01-13 ENCOUNTER — Ambulatory Visit (INDEPENDENT_AMBULATORY_CARE_PROVIDER_SITE_OTHER): Payer: Medicare Other | Admitting: *Deleted

## 2022-01-13 DIAGNOSIS — Z0000 Encounter for general adult medical examination without abnormal findings: Secondary | ICD-10-CM

## 2022-04-15 ENCOUNTER — Encounter: Payer: Self-pay | Admitting: Internal Medicine

## 2022-04-15 ENCOUNTER — Ambulatory Visit (INDEPENDENT_AMBULATORY_CARE_PROVIDER_SITE_OTHER): Payer: Medicare Other | Admitting: Internal Medicine

## 2022-04-15 VITALS — BP 140/80 | HR 65 | Temp 97.8°F | Ht 65.0 in | Wt 172.0 lb

## 2022-04-15 DIAGNOSIS — I2583 Coronary atherosclerosis due to lipid rich plaque: Secondary | ICD-10-CM | POA: Diagnosis not present

## 2022-04-15 DIAGNOSIS — E785 Hyperlipidemia, unspecified: Secondary | ICD-10-CM | POA: Diagnosis not present

## 2022-04-15 DIAGNOSIS — Z170 Estrogen receptor positive status [ER+]: Secondary | ICD-10-CM

## 2022-04-15 DIAGNOSIS — I2510 Atherosclerotic heart disease of native coronary artery without angina pectoris: Secondary | ICD-10-CM

## 2022-04-15 DIAGNOSIS — C50211 Malignant neoplasm of upper-inner quadrant of right female breast: Secondary | ICD-10-CM

## 2022-04-15 DIAGNOSIS — Z0000 Encounter for general adult medical examination without abnormal findings: Secondary | ICD-10-CM | POA: Diagnosis not present

## 2022-04-15 DIAGNOSIS — Z86010 Personal history of colonic polyps: Secondary | ICD-10-CM | POA: Diagnosis not present

## 2022-04-15 LAB — URINALYSIS, ROUTINE W REFLEX MICROSCOPIC
Bilirubin Urine: NEGATIVE
Hgb urine dipstick: NEGATIVE
Ketones, ur: NEGATIVE
Nitrite: NEGATIVE
RBC / HPF: NONE SEEN (ref 0–?)
Specific Gravity, Urine: 1.02 (ref 1.000–1.030)
Total Protein, Urine: NEGATIVE
Urine Glucose: NEGATIVE
Urobilinogen, UA: 0.2 (ref 0.0–1.0)
pH: 6.5 (ref 5.0–8.0)

## 2022-04-15 LAB — COMPREHENSIVE METABOLIC PANEL
ALT: 14 U/L (ref 0–35)
AST: 17 U/L (ref 0–37)
Albumin: 4.6 g/dL (ref 3.5–5.2)
Alkaline Phosphatase: 91 U/L (ref 39–117)
BUN: 15 mg/dL (ref 6–23)
CO2: 25 mEq/L (ref 19–32)
Calcium: 10 mg/dL (ref 8.4–10.5)
Chloride: 107 mEq/L (ref 96–112)
Creatinine, Ser: 0.83 mg/dL (ref 0.40–1.20)
GFR: 71.89 mL/min (ref >60.00)
Glucose, Bld: 98 mg/dL (ref 70–99)
Potassium: 4.3 mEq/L (ref 3.5–5.1)
Sodium: 144 mEq/L (ref 135–145)
Total Bilirubin: 0.8 mg/dL (ref 0.2–1.2)
Total Protein: 7.3 g/dL (ref 6.0–8.3)

## 2022-04-15 LAB — CBC WITH DIFFERENTIAL/PLATELET
Basophils Absolute: 0.1 10*3/uL (ref 0.0–0.1)
Basophils Relative: 1.5 % (ref 0.0–3.0)
Eosinophils Absolute: 0.2 10*3/uL (ref 0.0–0.7)
Eosinophils Relative: 4.8 % (ref 0.0–5.0)
HCT: 44 % (ref 36.0–46.0)
Hemoglobin: 14.9 g/dL (ref 12.0–15.0)
Lymphocytes Relative: 44.6 % (ref 12.0–46.0)
Lymphs Abs: 1.8 10*3/uL (ref 0.7–4.0)
MCHC: 34 g/dL (ref 30.0–36.0)
MCV: 91 fl (ref 78.0–100.0)
Monocytes Absolute: 0.3 10*3/uL (ref 0.1–1.0)
Monocytes Relative: 8.6 % (ref 3.0–12.0)
Neutro Abs: 1.6 10*3/uL (ref 1.4–7.7)
Neutrophils Relative %: 40.5 % — ABNORMAL LOW (ref 43.0–77.0)
Platelets: 380 10*3/uL (ref 150.0–400.0)
RBC: 4.83 Mil/uL (ref 3.87–5.11)
RDW: 13.3 % (ref 11.5–15.5)
WBC: 4 10*3/uL (ref 4.0–10.5)

## 2022-04-15 LAB — LIPID PANEL
Cholesterol: 286 mg/dL — ABNORMAL HIGH (ref 0–200)
HDL: 67 mg/dL (ref >39.00)
LDL Cholesterol: 201 mg/dL — ABNORMAL HIGH (ref 0–99)
NonHDL: 219.19
Total CHOL/HDL Ratio: 4
Triglycerides: 92 mg/dL (ref 0.0–149.0)
VLDL: 18.4 mg/dL (ref 0.0–40.0)

## 2022-04-15 LAB — TSH: TSH: 2.72 u[IU]/mL (ref 0.35–5.50)

## 2022-04-15 MED ORDER — MEGARED OMEGA-3 KRILL OIL 500 MG PO CAPS: 1.0000 | ORAL_CAPSULE | Freq: Every morning | ORAL | 3 refills | Status: AC

## 2022-04-15 NOTE — Progress Notes (Signed)
Subjective:  Patient ID: Kristy Singh, female    DOB: June 22, 1950  Age: 72 y.o. MRN: [id number]  CC: No chief complaint on file.   HPI Kristy Singh presents for a well exam  Outpatient Medications Prior to Visit  Medication Sig Dispense Refill   Cholecalciferol (VITAMIN D3) 1000 UNITS tablet Take 1,000 Units by mouth daily.     Docusate Calcium (STOOL SOFTENER PO) Take 1 tablet by mouth every other day. Rexall stool softener     Turmeric 500 MG CAPS Take 500 mg by mouth daily.     anastrozole (ARIMIDEX) 1 MG tablet Take 1 tablet (1 mg total) by mouth daily. (Patient not taking: Reported on 04/15/2022) 90 tablet 3   ondansetron (ZOFRAN) 4 MG tablet Take 1 tablet (4 mg total) by mouth every 8 (eight) hours as needed for nausea or vomiting. 30 tablet 1   No facility-administered medications prior to visit.    ROS: Review of Systems  Constitutional:  Negative for activity change, appetite change, chills, fatigue and unexpected weight change.  HENT:  Negative for congestion, mouth sores and sinus pressure.   Eyes:  Negative for visual disturbance.  Respiratory:  Negative for cough and chest tightness.   Gastrointestinal:  Negative for abdominal pain and nausea.  Genitourinary:  Negative for difficulty urinating, frequency and vaginal pain.  Musculoskeletal:  Negative for back pain and gait problem.  Skin:  Negative for pallor and rash.  Neurological:  Negative for dizziness, tremors, weakness, numbness and headaches.  Psychiatric/Behavioral:  Negative for confusion and sleep disturbance.     Objective:  BP (!) 160/90 (BP Location: Left Arm, Patient Position: Sitting, Cuff Size: Normal)   Pulse 65   Temp 97.8 F (36.6 C) (Oral)   Ht 5' 5"$  (1.651 m)   Wt 172 lb (78 kg)   SpO2 97%   BMI 28.62 kg/m   BP Readings from Last 3 Encounters:  04/15/22 (!) 160/90  06/16/21 131/60  04/09/21 118/82    Wt Readings from Last 3 Encounters:  04/15/22 172 lb (78 kg)   06/16/21 168 lb (76.2 kg)  05/26/21 170 lb (77.1 kg)    Physical Exam Constitutional:      General: She is not in acute distress.    Appearance: Normal appearance. She is well-developed.  HENT:     Head: Normocephalic.     Right Ear: External ear normal.     Left Ear: External ear normal.     Nose: Nose normal.  Eyes:     General:        Right eye: No discharge.        Left eye: No discharge.     Conjunctiva/sclera: Conjunctivae normal.     Pupils: Pupils are equal, round, and reactive to light.  Neck:     Thyroid: No thyromegaly.     Vascular: No JVD.     Trachea: No tracheal deviation.  Cardiovascular:     Rate and Rhythm: Normal rate and regular rhythm.     Heart sounds: Normal heart sounds.  Pulmonary:     Effort: No respiratory distress.     Breath sounds: No stridor. No wheezing.  Abdominal:     General: Bowel sounds are normal. There is no distension.     Palpations: Abdomen is soft. There is no mass.     Tenderness: There is no abdominal tenderness. There is no guarding or rebound.  Musculoskeletal:        General: No tenderness.  Cervical back: Normal range of motion and neck supple. No rigidity.  Lymphadenopathy:     Cervical: No cervical adenopathy.  Skin:    Findings: No erythema or rash.  Neurological:     Cranial Nerves: No cranial nerve deficit.     Motor: No abnormal muscle tone.     Coordination: Coordination normal.     Deep Tendon Reflexes: Reflexes normal.  Psychiatric:        Behavior: Behavior normal.        Thought Content: Thought content normal.        Judgment: Judgment normal.     Lab Results  Component Value Date   WBC 3.8 (L) 04/09/2021   HGB 13.8 04/09/2021   HCT 41.1 04/09/2021   PLT 340.0 04/09/2021   GLUCOSE 94 04/09/2021   CHOL 183 04/09/2021   TRIG 113.0 04/09/2021   HDL 69.90 04/09/2021   LDLDIRECT 189.4 08/29/2010   LDLCALC 91 04/09/2021   ALT 14 04/09/2021   AST 16 04/09/2021   NA 142 04/09/2021   K 4.4  04/09/2021   CL 107 04/09/2021   CREATININE 0.78 04/09/2021   BUN 13 04/09/2021   CO2 27 04/09/2021   TSH 2.62 04/09/2021    MM 3D SCREEN BREAST BILATERAL  Result Date: 08/01/2020 CLINICAL DATA:  Screening. EXAM: DIGITAL SCREENING BILATERAL MAMMOGRAM WITH TOMOSYNTHESIS AND CAD TECHNIQUE: Bilateral screening digital craniocaudal and mediolateral oblique mammograms were obtained. Bilateral screening digital breast tomosynthesis was performed. The images were evaluated with computer-aided detection. COMPARISON:  Previous exam(s). ACR Breast Density Category b: There are scattered areas of fibroglandular density. FINDINGS: There are no findings suspicious for malignancy. The images were evaluated with computer-aided detection. IMPRESSION: No mammographic evidence of malignancy. A result letter of this screening mammogram will be mailed directly to the patient. RECOMMENDATION: Screening mammogram in one year. (Code:SM-B-01Y) BI-RADS CATEGORY  1: Negative. Electronically Signed   By: Claudie Revering M.D.   On: 08/01/2020 08:58    Assessment & Plan:   Problem List Items Addressed This Visit       Cardiovascular and Mediastinum   Coronary atherosclerosis     CT coronary calcium score of 3. Focal Calcification of the LAD.  Moderate calcification of the ascending aorta      Relevant Orders   TSH   CBC with Differential/Platelet   Lipid panel     Other   Well adult exam - Primary     We discussed age appropriate health related issues, including available/recomended screening tests and vaccinations. We discussed a need for adhering to healthy diet and exercise. Labs ordered. All questions were answered. Colon due 2026      Relevant Orders   TSH   Urinalysis   CBC with Differential/Platelet   Lipid panel   Comprehensive metabolic panel   Hx of adenomatous colonic polyps    Colonoscopy recall 2026      Dyslipidemia   Relevant Orders   TSH   Lipid panel   Breast cancer of upper-inner  quadrant of right female breast ([place])    Was on Arimidex x 7 years - stopped      Relevant Orders   CBC with Differential/Platelet      Meds ordered this encounter  Medications   MegaRed Omega-3 Krill Oil 500 MG CAPS    Sig: Take 1 capsule by mouth every morning.    Dispense:  100 capsule    Refill:  3      Follow-up: Return in about 1  year (around 04/16/2023) for Wellness Exam.  Walker Kehr, MD

## 2022-04-15 NOTE — Assessment & Plan Note (Signed)
Colonoscopy recall 2026

## 2022-04-15 NOTE — Assessment & Plan Note (Signed)
CT coronary calcium score of 3. Focal Calcification of the LAD.  Moderate calcification of the ascending aorta

## 2022-04-15 NOTE — Assessment & Plan Note (Signed)
Was on Arimidex x 7 years - stopped

## 2022-04-15 NOTE — Assessment & Plan Note (Signed)
  We discussed age appropriate health related issues, including available/recomended screening tests and vaccinations. We discussed a need for adhering to healthy diet and exercise. Labs ordered. All questions were answered. Colon due 2026

## 2022-04-27 ENCOUNTER — Encounter: Payer: Self-pay | Admitting: Internal Medicine

## 2022-04-29 ENCOUNTER — Other Ambulatory Visit: Payer: Self-pay | Admitting: Internal Medicine

## 2022-04-29 MED ORDER — CEPHALEXIN 500 MG PO CAPS: 500.0000 mg | ORAL_CAPSULE | Freq: Three times a day (TID) | ORAL | 0 refills | Status: DC

## 2022-04-29 MED ORDER — ROSUVASTATIN CALCIUM 10 MG PO TABS: 10.0000 mg | ORAL_TABLET | Freq: Every day | ORAL | 3 refills | Status: DC

## 2022-05-04 MED ORDER — ROSUVASTATIN CALCIUM 10 MG PO TABS: 10.0000 mg | ORAL_TABLET | Freq: Every day | ORAL | 3 refills | Status: DC

## 2023-04-16 ENCOUNTER — Other Ambulatory Visit: Payer: Self-pay | Admitting: Internal Medicine

## 2023-04-19 ENCOUNTER — Encounter: Payer: Medicare Other | Admitting: Internal Medicine

## 2023-04-20 ENCOUNTER — Ambulatory Visit (INDEPENDENT_AMBULATORY_CARE_PROVIDER_SITE_OTHER): Payer: Medicare Other | Admitting: Internal Medicine

## 2023-04-20 ENCOUNTER — Encounter: Payer: Self-pay | Admitting: Internal Medicine

## 2023-04-20 VITALS — BP 110/70 | HR 61 | Temp 97.8°F | Ht 65.0 in | Wt 175.0 lb

## 2023-04-20 DIAGNOSIS — E785 Hyperlipidemia, unspecified: Secondary | ICD-10-CM

## 2023-04-20 DIAGNOSIS — Z0000 Encounter for general adult medical examination without abnormal findings: Secondary | ICD-10-CM | POA: Diagnosis not present

## 2023-04-20 DIAGNOSIS — C50211 Malignant neoplasm of upper-inner quadrant of right female breast: Secondary | ICD-10-CM | POA: Diagnosis not present

## 2023-04-20 DIAGNOSIS — Z170 Estrogen receptor positive status [ER+]: Secondary | ICD-10-CM

## 2023-04-20 LAB — CBC WITH DIFFERENTIAL/PLATELET
Basophils Absolute: 0.1 10*3/uL (ref 0.0–0.1)
Basophils Relative: 1.3 % (ref 0.0–3.0)
Eosinophils Absolute: 0.1 10*3/uL (ref 0.0–0.7)
Eosinophils Relative: 3.2 % (ref 0.0–5.0)
HCT: 43.7 % (ref 36.0–46.0)
Hemoglobin: 14.7 g/dL (ref 12.0–15.0)
Lymphocytes Relative: 45.2 % (ref 12.0–46.0)
Lymphs Abs: 1.9 10*3/uL (ref 0.7–4.0)
MCHC: 33.6 g/dL (ref 30.0–36.0)
MCV: 91.1 fL (ref 78.0–100.0)
Monocytes Absolute: 0.4 10*3/uL (ref 0.1–1.0)
Monocytes Relative: 8.5 % (ref 3.0–12.0)
Neutro Abs: 1.7 10*3/uL (ref 1.4–7.7)
Neutrophils Relative %: 41.8 % — ABNORMAL LOW (ref 43.0–77.0)
Platelets: 357 10*3/uL (ref 150.0–400.0)
RBC: 4.79 Mil/uL (ref 3.87–5.11)
RDW: 12.7 % (ref 11.5–15.5)
WBC: 4.2 10*3/uL (ref 4.0–10.5)

## 2023-04-20 LAB — URINALYSIS, ROUTINE W REFLEX MICROSCOPIC
Bilirubin Urine: NEGATIVE
Hgb urine dipstick: NEGATIVE
Ketones, ur: NEGATIVE
Nitrite: NEGATIVE
RBC / HPF: NONE SEEN (ref 0–?)
Specific Gravity, Urine: <=1.005 — AB (ref 1.000–1.030)
Total Protein, Urine: NEGATIVE
Urine Glucose: NEGATIVE
Urobilinogen, UA: 0.2 (ref 0.0–1.0)
pH: 6 (ref 5.0–8.0)

## 2023-04-20 LAB — COMPREHENSIVE METABOLIC PANEL
ALT: 17 U/L (ref 0–35)
AST: 18 U/L (ref 0–37)
Albumin: 4.7 g/dL (ref 3.5–5.2)
Alkaline Phosphatase: 83 U/L (ref 39–117)
BUN: 16 mg/dL (ref 6–23)
CO2: 28 meq/L (ref 19–32)
Calcium: 9.6 mg/dL (ref 8.4–10.5)
Chloride: 105 meq/L (ref 96–112)
Creatinine, Ser: 0.74 mg/dL (ref 0.40–1.20)
GFR: 81.93 mL/min (ref >60.00)
Glucose, Bld: 87 mg/dL (ref 70–99)
Potassium: 3.9 meq/L (ref 3.5–5.1)
Sodium: 141 meq/L (ref 135–145)
Total Bilirubin: 0.9 mg/dL (ref 0.2–1.2)
Total Protein: 7.5 g/dL (ref 6.0–8.3)

## 2023-04-20 LAB — LIPID PANEL
Cholesterol: 151 mg/dL (ref 0–200)
HDL: 59.9 mg/dL (ref >39.00)
LDL Cholesterol: 75 mg/dL (ref 0–99)
NonHDL: 90.62
Total CHOL/HDL Ratio: 3
Triglycerides: 77 mg/dL (ref 0.0–149.0)
VLDL: 15.4 mg/dL (ref 0.0–40.0)

## 2023-04-20 LAB — TSH: TSH: 2.26 u[IU]/mL (ref 0.35–5.50)

## 2023-04-20 MED ORDER — ROSUVASTATIN CALCIUM 10 MG PO TABS: 10.0000 mg | ORAL_TABLET | Freq: Every day | ORAL | 3 refills | Status: AC

## 2023-04-20 NOTE — Assessment & Plan Note (Signed)
 Pt stopped Arimidex after 7 years Last mammo was ok in 2024

## 2023-04-20 NOTE — Progress Notes (Signed)
 Subjective:  Patient ID: Kristy Singh, female    DOB: [birth year]  Age: 73 y.o. MRN: [id number]  CC: Annual Exam   HPI Kristy Singh presents for a well exam Pt stopped Arimidex after 7 years  Outpatient Medications Prior to Visit  Medication Sig Dispense Refill   Cholecalciferol (VITAMIN D3) 1000 UNITS tablet Take 1,000 Units by mouth daily.     Docusate Calcium (STOOL SOFTENER PO) Take 1 tablet by mouth every other day. Rexall stool softener     MegaRed Omega-3 Krill Oil 500 MG CAPS Take 1 capsule by mouth every morning. 100 capsule 3   rosuvastatin (CRESTOR) 10 MG tablet TAKE 1 TABLET DAILY 90 tablet 3   Turmeric 500 MG CAPS Take 500 mg by mouth daily.     cephALEXin (KEFLEX) 500 MG capsule Take 1 capsule (500 mg total) by mouth 3 (three) times daily. 15 capsule 0   No facility-administered medications prior to visit.    ROS: Review of Systems  Constitutional:  Negative for activity change, appetite change, chills, fatigue and unexpected weight change.  HENT:  Negative for congestion, mouth sores and sinus pressure.   Eyes:  Negative for visual disturbance.  Respiratory:  Negative for cough and chest tightness.   Gastrointestinal:  Negative for abdominal pain and nausea.  Genitourinary:  Negative for difficulty urinating, frequency and vaginal pain.  Musculoskeletal:  Negative for back pain and gait problem.  Skin:  Negative for pallor and rash.  Neurological:  Negative for dizziness, tremors, weakness, numbness and headaches.  Psychiatric/Behavioral:  Negative for confusion and sleep disturbance.     Objective:  BP 110/70   Pulse 61   Temp 97.8 F (36.6 C) (Oral)   Ht 5\' 5"  (1.651 m)   Wt 175 lb (79.4 kg)   SpO2 98%   BMI 29.12 kg/m   BP Readings from Last 3 Encounters:  04/20/23 110/70  04/15/22 (!) 140/80  06/16/21 131/60    Wt Readings from Last 3 Encounters:  04/20/23 175 lb (79.4 kg)  04/15/22 172 lb (78 kg)  06/16/21 168 lb (76.2 kg)     Physical Exam Constitutional:      General: She is not in acute distress.    Appearance: She is well-developed. She is obese.  HENT:     Head: Normocephalic.     Right Ear: External ear normal.     Left Ear: External ear normal.     Nose: Nose normal.  Eyes:     General:        Right eye: No discharge.        Left eye: No discharge.     Conjunctiva/sclera: Conjunctivae normal.     Pupils: Pupils are equal, round, and reactive to light.  Neck:     Thyroid: No thyromegaly.     Vascular: No JVD.     Trachea: No tracheal deviation.  Cardiovascular:     Rate and Rhythm: Normal rate and regular rhythm.     Heart sounds: Normal heart sounds.  Pulmonary:     Effort: No respiratory distress.     Breath sounds: No stridor. No wheezing.  Abdominal:     General: Bowel sounds are normal. There is no distension.     Palpations: Abdomen is soft. There is no mass.     Tenderness: There is no abdominal tenderness. There is no guarding or rebound.  Musculoskeletal:        General: No tenderness.     Cervical  back: Normal range of motion and neck supple. No rigidity.  Lymphadenopathy:     Cervical: No cervical adenopathy.  Skin:    Findings: No erythema or rash.  Neurological:     Cranial Nerves: No cranial nerve deficit.     Motor: No abnormal muscle tone.     Coordination: Coordination normal.     Deep Tendon Reflexes: Reflexes normal.  Psychiatric:        Behavior: Behavior normal.        Thought Content: Thought content normal.        Judgment: Judgment normal.     Lab Results  Component Value Date   WBC 4.0 04/15/2022   HGB 14.9 04/15/2022   HCT 44.0 04/15/2022   PLT 380.0 04/15/2022   GLUCOSE 98 04/15/2022   CHOL 286 (H) 04/15/2022   TRIG 92.0 04/15/2022   HDL 67.00 04/15/2022   LDLDIRECT 189.4 08/29/2010   LDLCALC 201 (H) 04/15/2022   ALT 14 04/15/2022   AST 17 04/15/2022   NA 144 04/15/2022   K 4.3 04/15/2022   CL 107 04/15/2022   CREATININE 0.83 04/15/2022    BUN 15 04/15/2022   CO2 25 04/15/2022   TSH 2.72 04/15/2022    MM 3D SCREEN BREAST BILATERAL Result Date: 08/01/2020 CLINICAL DATA:  Screening. EXAM: DIGITAL SCREENING BILATERAL MAMMOGRAM WITH TOMOSYNTHESIS AND CAD TECHNIQUE: Bilateral screening digital craniocaudal and mediolateral oblique mammograms were obtained. Bilateral screening digital breast tomosynthesis was performed. The images were evaluated with computer-aided detection. COMPARISON:  Previous exam(s). ACR Breast Density Category b: There are scattered areas of fibroglandular density. FINDINGS: There are no findings suspicious for malignancy. The images were evaluated with computer-aided detection. IMPRESSION: No mammographic evidence of malignancy. A result letter of this screening mammogram will be mailed directly to the patient. RECOMMENDATION: Screening mammogram in one year. (Code:SM-B-01Y) BI-RADS CATEGORY  1: Negative. Electronically Signed   By: Beckie Salts M.D.   On: 08/01/2020 08:58    Assessment & Plan:   Problem List Items Addressed This Visit     Dyslipidemia   Relevant Orders   TSH   Lipid panel   Well adult exam - Primary   Relevant Orders   TSH   Urinalysis   CBC with Differential/Platelet   Lipid panel   Comprehensive metabolic panel   Breast cancer of upper-inner quadrant of right female breast ([facility])   Pt stopped Arimidex after 7 years Last mammo was ok in 2024      Relevant Orders   CBC with Differential/Platelet   Comprehensive metabolic panel      No orders of the defined types were placed in this encounter.     Follow-up: Return in about 3 months (around 07/18/2023) for a follow-up visit.  Sonda Primes, MD

## 2023-04-21 ENCOUNTER — Encounter: Payer: Self-pay | Admitting: Internal Medicine

## 2023-06-04 ENCOUNTER — Ambulatory Visit

## 2023-06-04 VITALS — Ht 64.5 in | Wt 175.0 lb

## 2023-06-04 DIAGNOSIS — Z0000 Encounter for general adult medical examination without abnormal findings: Secondary | ICD-10-CM | POA: Diagnosis not present

## 2023-06-04 NOTE — Progress Notes (Signed)
 Subjective:   Kristy Singh is a 73 y.o. who presents for a Medicare Wellness preventive visit.  Visit Complete: Virtual I connected with  Kristy Singh on 06/04/23 by a video and audio enabled telemedicine application and verified that I am speaking with the correct person using two identifiers.  Patient Location: Home  Provider Location: Office/Clinic  I discussed the limitations of evaluation and management by telemedicine. The patient expressed understanding and agreed to proceed.  Vital Signs: Because this visit was a virtual/telehealth visit, some criteria may be missing or patient reported. Any vitals not documented were not able to be obtained and vitals that have been documented are patient reported.   Persons Participating in Visit: Patient.  AWV Questionnaire: No: Patient Medicare AWV questionnaire was not completed prior to this visit.  Cardiac Risk Factors include: advanced age (>75men, >76 women);dyslipidemia     Objective:    Today's Vitals   06/04/23 1305  Weight: 175 lb (79.4 kg)  Height: 5' 4.5" (1.638 m)   Body mass index is 29.57 kg/m.     06/04/2023    1:03 PM 01/13/2022   11:24 AM 09/16/2016    8:23 AM 03/19/2016    9:38 AM 12/09/2015    5:14 PM 11/12/2015    1:39 PM 09/04/2015    8:47 AM  Advanced Directives  Does Patient Have a Medical Advance Directive? No No Yes No No No No  Would patient like information on creating a medical advance directive? Yes (MAU/Ambulatory/Procedural Areas - Information given) No - Patient declined    No - patient declined information No - patient declined information    Current Medications (verified) Outpatient Encounter Medications as of 06/04/2023  Medication Sig   Cholecalciferol (VITAMIN D3) 1000 UNITS tablet Take 1,000 Units by mouth daily.   Docusate Calcium (STOOL SOFTENER PO) Take 1 tablet by mouth every other day. [employer] stool softener   MegaRed Omega-3 Krill Oil 500 MG CAPS Take 1 capsule by  mouth every morning.   rosuvastatin (CRESTOR) 10 MG tablet Take 1 tablet (10 mg total) by mouth daily.   Turmeric 500 MG CAPS Take 500 mg by mouth daily.   No facility-administered encounter medications on file as of 06/04/2023.    Allergies (verified) Patient has no known allergies.   History: Past Medical History:  Diagnosis Date   Breast cancer (HCC) 2017   Right Breast   Cancer (HCC)    right breast cancer   Gallstones    GERD (gastroesophageal reflux disease)    History of bronchitis    History of gallstones    Hx of adenomatous colonic polyps 07/06/2003   Hyperlipidemia    Influenza    Influenza A 06/03/2017   4/19   Kidney stones    Dr Lynnae Sandhoff   Numbness and tingling in hands    Personal history of radiation therapy 2017   Right Breast Cancer   Prolapsed internal hemorrhoids, grade 2 and 3 07/21/2017   Wears glasses    Past Surgical History:  Procedure Laterality Date   ABDOMINAL HYSTERECTOMY  2006   BREAST LUMPECTOMY Right 2017   BREAST LUMPECTOMY WITH RADIOACTIVE SEED AND SENTINEL LYMPH NODE BIOPSY Right 08/02/2015   Procedure: BREAST LUMPECTOMY WITH RADIOACTIVE SEED AND SENTINEL LYMPH NODE BIOPSY;  Surgeon: Chevis Pretty III, MD;  Location: [facility] OR;  Service: General;  Laterality: Right;   CHOLECYSTECTOMY  2008   Dr. Dawayne Patricia  in [place], had ERCP post -op    COLONOSCOPY  2005,  2009   05/2017- CG, TA's   CYSTO     with holmium lasar   DILATION AND CURETTAGE OF UTERUS  1980   ERCP     HEMORRHOID BANDING     NSVD     x2   OVARIAN CYST REMOVAL Right    POLYPECTOMY     TONSILLECTOMY     Family History  Problem Relation Age of Onset   Hypertension Mother    Hyperlipidemia Mother    Breast cancer Mother 64   Colon polyps Father    Colon cancer Father 38   Colon polyps Sister    Colon polyps Sister    Hypertension Other    Rectal cancer Neg Hx    Stomach cancer Neg Hx    Social History   Socioeconomic History   Marital status: Married    Spouse  name: Not on file   Number of children: Not on file   Years of education: Not on file   Highest education level: Some college, no degree  Occupational History   Occupation: computers  Tobacco Use   Smoking status: Never    Passive exposure: Never (in the 70s in offices)   Smokeless tobacco: Never  Vaping Use   Vaping status: Never Used  Substance and Sexual Activity   Alcohol use: Never   Drug use: Never   Sexual activity: Yes    Birth control/protection: Surgical  Other Topics Concern   Not on file  Social History Narrative   Married   Social Drivers of Health   Financial Resource Strain: Low Risk  (06/04/2023)   Overall Financial Resource Strain (CARDIA)    Difficulty of Paying Living Expenses: Not hard at all  Food Insecurity: No Food Insecurity (06/04/2023)   Hunger Vital Sign    Worried About Running Out of Food in the Last Year: Never true    Ran Out of Food in the Last Year: Never true  Transportation Needs: No Transportation Needs (06/04/2023)   PRAPARE - Administrator, Civil Service (Medical): No    Lack of Transportation (Non-Medical): No  Physical Activity: Sufficiently Active (06/04/2023)   Exercise Vital Sign    Days of Exercise per Week: 5 days    Minutes of Exercise per Session: 40 min  Recent Concern: Physical Activity - Insufficiently Active (04/19/2023)   Exercise Vital Sign    Days of Exercise per Week: 6 days    Minutes of Exercise per Session: 20 min  Stress: No Stress Concern Present (06/04/2023)   [employer] of Occupational Health - Occupational Stress Questionnaire    Feeling of Stress : Not at all  Social Connections: Socially Integrated (06/04/2023)   Social Connection and Isolation Panel [NHANES]    Frequency of Communication with Friends and Family: More than three times a week    Frequency of Social Gatherings with Friends and Family: More than three times a week    Attends Religious Services: More than 4 times per year     Active Member of [employer] or Organizations: Yes    Attends Engineer, structural: More than 4 times per year    Marital Status: Married    Tobacco Counseling Counseling given: Not Answered    Clinical Intake:  Pre-visit preparation completed: Yes  Pain : No/denies pain     BMI - recorded: 29.57 Nutritional Status: BMI 25 -29 Overweight Nutritional Risks: None Diabetes: No  No results found for: "HGBA1C"   How often do you need  to have someone help you when you read instructions, pamphlets, or other written materials from your doctor or pharmacy?: 1 - Never  Interpreter Needed?: No  Information entered by :: Hassell Halim, CMA   Activities of Daily Living     06/04/2023    1:09 PM  In your present state of health, do you have any difficulty performing the following activities:  Hearing? 0  Vision? 0  Difficulty concentrating or making decisions? 0  Walking or climbing stairs? 0  Dressing or bathing? 0  Doing errands, shopping? 0  Preparing Food and eating ? N  Using the Toilet? N  In the past six months, have you accidently leaked urine? N  Do you have problems with loss of bowel control? N  Managing your Medications? N  Managing your Finances? N  Housekeeping or managing your Housekeeping? N    Patient Care Team: Plotnikov, Georgina Quint, MD as PCP - General Levi Aland, MD as Consulting Physician (Obstetrics and Gynecology) Serena Croissant, MD as Consulting Physician (Hematology and Oncology) Griselda Miner, MD as Consulting Physician (General Surgery) Dorothy Puffer, MD as Consulting Physician (Radiation Oncology) Iva Boop, MD as Consulting Physician (Gastroenterology) Waynard Reeds, MD as Consulting Physician (Obstetrics and Gynecology)  Indicate any recent Medical Services you may have received from other than [facility] providers in the past year (date may be approximate).     Assessment:   This is a routine wellness examination for  [place].  Hearing/Vision screen Hearing Screening - Comments:: Denies hearing difficulties   Vision Screening - Comments:: Wears rx glasses - up to date with routine eye exams with Dr Earlene Plater    Goals Addressed               This Visit's Progress     Patient Stated (pt-stated)        Patient stated she wants to get out and walk when gets warmer and stay active with grandchildren.       Depression Screen     06/04/2023    1:15 PM 04/20/2023    1:36 PM 04/15/2022    9:41 AM 01/13/2022   11:28 AM 04/09/2021   11:12 AM 02/06/2020    8:14 AM 12/15/2017    7:49 AM  PHQ 2/9 Scores  PHQ - 2 Score 0 0 0 0 0 0 0  PHQ- 9 Score 0          Fall Risk     06/04/2023    1:10 PM 04/20/2023    1:36 PM 04/15/2022    9:41 AM 01/13/2022   11:25 AM 04/09/2021   11:11 AM  Fall Risk   Falls in the past year? 0 0 0 0 0  Number falls in past yr: 0 0 0 0 0  Injury with Fall? 0 0 0 0 0  Risk for fall due to : No Fall Risks No Fall Risks No Fall Risks No Fall Risks No Fall Risks  Follow up Falls prevention discussed;Falls evaluation completed Falls evaluation completed Falls evaluation completed Falls evaluation completed Falls evaluation completed    MEDICARE RISK AT HOME:  Medicare Risk at Home Any stairs in or around the home?: Yes If so, are there any without handrails?: No Home free of loose throw rugs in walkways, pet beds, electrical cords, etc?: Yes Adequate lighting in your home to reduce risk of falls?: Yes Life alert?: No Use of a cane, walker or w/c?: No Grab bars in the bathroom?: No Shower  chair or bench in shower?: Yes Elevated toilet seat or a handicapped toilet?: No  TIMED UP AND GO:  Was the test performed?  No  Cognitive Function: 6CIT completed        06/04/2023    1:14 PM 01/13/2022   11:25 AM  6CIT Screen  What Year? 0 points 0 points  What month? 0 points 0 points  What time? 0 points 0 points  Count back from 20 0 points 0 points  Months in reverse 0 points  0 points  Repeat phrase 0 points 0 points  Total Score 0 points 0 points    Immunizations Immunization History  Administered Date(s) Administered   DTaP 03/17/2021   Fluad Quad(high Dose 65+) 12/12/2018, 11/24/2019   Influenza Split 11/18/2011   Influenza Whole 12/25/2010   Influenza, High Dose Seasonal PF 12/15/2017   Influenza,inj,Quad PF,6+ Mos 11/28/2013, 11/30/2014, 12/03/2015   Influenza-Unspecified 11/20/2016, 12/06/2020, 12/08/2022   Moderna SARS-COV2 Booster Vaccination 01/10/2020, 03/17/2021   Moderna Sars-Covid-2 Vaccination 04/24/2019, 05/19/2019   Pneumococcal Conjugate-13 12/15/2017   Pneumococcal Polysaccharide-23 01/03/2019   Tdap 11/24/2011   Zoster Recombinant(Shingrix) 04/25/2021, 09/11/2021   Zoster, Live 11/24/2011    Screening Tests Health Maintenance  Topic Date Due   COVID-19 Vaccine (3 - Moderna risk series) 04/14/2021   MAMMOGRAM  07/31/2022   INFLUENZA VACCINE  09/24/2023   Medicare Annual Wellness (AWV)  06/03/2024   Colonoscopy  06/16/2024   DTaP/Tdap/Td (3 - Td or Tdap) 03/18/2031   Pneumonia Vaccine 80+ Years old  Completed   DEXA SCAN  Completed   Hepatitis C Screening  Completed   Zoster Vaccines- Shingrix  Completed   HPV VACCINES  Aged Out   Meningococcal B Vaccine  Aged Out    Health Maintenance  Health Maintenance Due  Topic Date Due   COVID-19 Vaccine (3 - Moderna risk series) 04/14/2021   MAMMOGRAM  07/31/2022   Health Maintenance Items Addressed: 06/04/2023  Mammogram status: Pt stated Dr Waynard Reeds orders Mammogram but will get scheduled for 2025.  Requested previous Mammogram report from Dr Tenny Craw' office.  Additional Screening:  Vision Screening: Recommended annual ophthalmology exams for early detection of glaucoma and other disorders of the eye. Pt stated she sees Dr Earlene Plater in [place], [place] for routine eye exams.  Dental Screening: Recommended annual dental exams for proper oral hygiene  Community Resource Referral /  Chronic Care Management: CRR required this visit?  No   CCM required this visit?  No     Plan:     I have personally reviewed and noted the following in the patient's chart:   Medical and social history Use of alcohol, tobacco or illicit drugs  Current medications and supplements including opioid prescriptions. Patient is not currently taking opioid prescriptions. Functional ability and status Nutritional status Physical activity Advanced directives List of other physicians Hospitalizations, surgeries, and ER visits in previous 12 months Vitals Screenings to include cognitive, depression, and falls Referrals and appointments  In addition, I have reviewed and discussed with patient certain preventive protocols, quality metrics, and best practice recommendations. A written personalized care plan for preventive services as well as general preventive health recommendations were provided to patient.     Darreld Mclean, CMA   06/04/2023   After Visit Summary: (MyChart) Due to this being a telephonic visit, the after visit summary with patients personalized plan was offered to patient via MyChart   Notes: Nothing significant to report at this time.

## 2023-06-04 NOTE — Patient Instructions (Addendum)
 Kristy Singh , Thank you for taking time to come for your Medicare Wellness Visit. I appreciate your ongoing commitment to your health goals. Please review the following plan we discussed and let me know if I can assist you in the future.   Referrals/Orders/Follow-Ups/Clinician Recommendations: Aim for 30 minutes of exercise or brisk walking, 6-8 glasses of water, and 5 servings of fruits and vegetables each day.   This is a list of the screening recommended for you and due dates:  Health Maintenance  Topic Date Due   COVID-19 Vaccine (3 - Moderna risk series) 04/14/2021   Mammogram  07/31/2022   Flu Shot  09/24/2023   Medicare Annual Wellness Visit  06/03/2024   Colon Cancer Screening  06/16/2024   DTaP/Tdap/Td vaccine (3 - Td or Tdap) 03/18/2031   Pneumonia Vaccine  Completed   DEXA scan (bone density measurement)  Completed   Hepatitis C Screening  Completed   Zoster (Shingles) Vaccine  Completed   HPV Vaccine  Aged Out   Meningitis B Vaccine  Aged Out    Advanced directives: (Provided) Advance directive discussed with you today. I have provided a copy for you to complete at home and have notarized. Once this is complete, please bring a copy in to our office so we can scan it into your chart.   Next Medicare Annual Wellness Visit scheduled for next year: Yes

## 2023-07-20 ENCOUNTER — Ambulatory Visit: Payer: Medicare Other | Admitting: Internal Medicine

## 2024-06-06 ENCOUNTER — Ambulatory Visit
